# Patient Record
Sex: Female | Born: 1990 | Race: Black or African American | Hispanic: No | Marital: Single | Smoking: Current every day smoker
Health system: Southern US, Community
[De-identification: ages and names within clinical notes are randomized; demographics above are authoritative.]

## PROBLEM LIST (undated history)

## (undated) DIAGNOSIS — R87810 Cervical high risk human papillomavirus (HPV) DNA test positive: Principal | ICD-10-CM

## (undated) DIAGNOSIS — Z789 Other specified health status: Secondary | ICD-10-CM

## (undated) HISTORY — DX: Cervical high risk human papillomavirus (HPV) DNA test positive: R87.810

## (undated) HISTORY — PX: INDUCED ABORTION: SHX677

---

## 2015-04-04 ENCOUNTER — Encounter (HOSPITAL_COMMUNITY): Payer: Self-pay | Admitting: Emergency Medicine

## 2015-04-04 ENCOUNTER — Emergency Department (HOSPITAL_COMMUNITY)
Admission: EM | Admit: 2015-04-04 | Discharge: 2015-04-04 | Disposition: A | Discharge status: Home / Self Care | Payer: Medicaid Other | Attending: Emergency Medicine | Admitting: Emergency Medicine

## 2015-04-04 DIAGNOSIS — Z3202 Encounter for pregnancy test, result negative: Secondary | ICD-10-CM | POA: Insufficient documentation

## 2015-04-04 DIAGNOSIS — A64 Unspecified sexually transmitted disease: Secondary | ICD-10-CM

## 2015-04-04 DIAGNOSIS — N898 Other specified noninflammatory disorders of vagina: Secondary | ICD-10-CM | POA: Diagnosis present

## 2015-04-04 LAB — URINALYSIS, ROUTINE W REFLEX MICROSCOPIC
BILIRUBIN URINE: NEGATIVE
GLUCOSE, UA: NEGATIVE mg/dL
HGB URINE DIPSTICK: NEGATIVE
KETONES UR: NEGATIVE mg/dL
Nitrite: NEGATIVE
PROTEIN: NEGATIVE mg/dL
Specific Gravity, Urine: >1.03 — ABNORMAL HIGH (ref 1.005–1.030)
pH: 5.5 (ref 5.0–8.0)

## 2015-04-04 LAB — WET PREP, GENITAL
SPERM: NONE SEEN
Yeast Wet Prep HPF POC: NONE SEEN

## 2015-04-04 LAB — PREGNANCY, URINE: Preg Test, Ur: NEGATIVE

## 2015-04-04 LAB — URINE MICROSCOPIC-ADD ON: RBC / HPF: NONE SEEN RBC/hpf (ref 0–5)

## 2015-04-04 MED ORDER — METRONIDAZOLE 500 MG PO TABS
500.0000 mg | ORAL_TABLET | Freq: Once | ORAL | Status: AC
  Administered 2015-04-04: 500 mg via ORAL
  Filled 2015-04-04: qty 1

## 2015-04-04 MED ORDER — LIDOCAINE HCL (PF) 1 % IJ SOLN
INTRAMUSCULAR | Status: AC
  Administered 2015-04-04: 5 mL
  Filled 2015-04-04: qty 5

## 2015-04-04 MED ORDER — METRONIDAZOLE 500 MG PO TABS: 500.0000 mg | ORAL_TABLET | Freq: Three times a day (TID) | ORAL | Status: DC

## 2015-04-04 MED ORDER — AZITHROMYCIN 1 G PO PACK
1.0000 g | PACK | Freq: Once | ORAL | Status: AC
  Administered 2015-04-04: 1 g via ORAL
  Filled 2015-04-04: qty 1

## 2015-04-04 MED ORDER — CEFTRIAXONE SODIUM 250 MG IJ SOLR
250.0000 mg | Freq: Once | INTRAMUSCULAR | Status: AC
  Administered 2015-04-04: 250 mg via INTRAMUSCULAR
  Filled 2015-04-04: qty 250

## 2015-04-04 NOTE — ED Notes (Signed)
MD Mesner at bedside updating patient.

## 2015-04-04 NOTE — ED Notes (Signed)
PT states yellow odorous vaginal discharge with irritation x1 week. PT denies any urinary symptoms and states condom use with intercourse.

## 2015-04-04 NOTE — ED Notes (Signed)
Pelvic setup at bedside.

## 2015-04-04 NOTE — ED Notes (Signed)
MD Mesner at bedside  °

## 2015-04-04 NOTE — ED Provider Notes (Signed)
CSN: [id number]     Arrival date & time 04/04/15  [id number] History   First MD Initiated Contact with Patient 04/04/15 [phone]     Chief Complaint  Patient presents with  . Vaginal Discharge     (Consider location/radiation/quality/duration/timing/severity/associated sxs/prior Treatment) Patient is a 24 y.o. female presenting with vaginal discharge.  Vaginal Discharge Quality:  Malodorous, thick and yellow Severity:  Mild Onset quality:  Gradual Duration:  3 days Timing:  Constant Progression:  Worsening Chronicity:  New Context: not after intercourse and not after urination   Relieved by:  None tried Worsened by:  Nothing tried Ineffective treatments:  None tried Associated symptoms: no dysuria     History reviewed. No pertinent past medical history. History reviewed. No pertinent past surgical history. History reviewed. No pertinent family history. Social History  Substance Use Topics  . Smoking status: Never Smoker   . Smokeless tobacco: None  . Alcohol Use: Yes     Comment: rarely   OB History    Gravida Para Term Preterm AB TAB SAB Ectopic Multiple Living            1     Review of Systems  Respiratory: Negative for cough and shortness of breath.   Genitourinary: Positive for vaginal discharge and genital sores. Negative for dysuria, difficulty urinating, vaginal pain and menstrual problem.      Allergies  Review of patient's allergies indicates no known allergies.  Home Medications   Prior to Admission medications   Medication Sig Start Date End Date Taking? Authorizing Provider  metroNIDAZOLE (FLAGYL) 500 MG tablet Take 1 tablet (500 mg total) by mouth 3 (three) times daily. 04/04/15   Barbara CowerJason Eleanor Dimichele, MD   BP 128/90 mmHg  Pulse 74  Temp(Src) 97.7 F (36.5 C) (Oral)  Resp 14  SpO2 100%  LMP 03/21/2015 Physical Exam  Constitutional: She is oriented to person, place, and time. She appears well-developed and well-nourished.  HENT:  Head: Normocephalic and  atraumatic.  Eyes: Pupils are equal, round, and reactive to light.  Neck: Normal range of motion.  Cardiovascular: Normal rate and regular rhythm.   Pulmonary/Chest: Effort normal and breath sounds normal. No stridor. No respiratory distress.  Abdominal: Soft. She exhibits no distension. There is no tenderness.  Genitourinary: Cervix exhibits discharge. Cervix exhibits no motion tenderness. Right adnexum displays no tenderness and no fullness. Left adnexum displays no tenderness and no fullness. No erythema, tenderness or bleeding in the vagina. Vaginal discharge found.  Musculoskeletal: Normal range of motion. She exhibits no edema or tenderness.  Neurological: She is alert and oriented to person, place, and time.  Skin: Skin is warm and dry.  Nursing note and vitals reviewed.   ED Course  Procedures (including critical care time) Labs Review Labs Reviewed  WET PREP, GENITAL - Abnormal; Notable for the following:    Trich, Wet Prep PRESENT (*)    Clue Cells Wet Prep HPF POC PRESENT (*)    WBC, Wet Prep HPF POC MANY (*)    All other components within normal limits  URINALYSIS, ROUTINE W REFLEX MICROSCOPIC (NOT [facility]) - Abnormal; Notable for the following:    Specific Gravity, Urine >1.030 (*)    Leukocytes, UA SMALL (*)    All other components within normal limits  URINE MICROSCOPIC-ADD ON - Abnormal; Notable for the following:    Squamous Epithelial / LPF 6-30 (*)    Bacteria, UA FEW (*)    All other components within normal limits  PREGNANCY,  URINE  RPR  HIV ANTIBODY (ROUTINE TESTING)  GC/CHLAMYDIA PROBE AMP ([place]) NOT AT [facility]  WET PREP  (BD AFFIRM) ()    Imaging Review No results found. I have personally reviewed and evaluated these images and lab results as part of my medical decision-making.   EKG Interpretation None      MDM   Final diagnoses:  STD (female)   eval for STD, UTI.   Urine ok. Trich and BV on wet prep. Will ppx tx for std's.  tx for bv, advise probiotics.       Marily Memos, MD 04/04/15 [phone]

## 2015-04-05 LAB — HIV ANTIBODY (ROUTINE TESTING W REFLEX): HIV SCREEN 4TH GENERATION: NONREACTIVE

## 2015-04-05 LAB — RPR: RPR: NONREACTIVE

## 2015-04-06 LAB — GC/CHLAMYDIA PROBE AMP (~~LOC~~) NOT AT ARMC
CHLAMYDIA, DNA PROBE: NEGATIVE
Neisseria Gonorrhea: NEGATIVE

## 2015-04-11 ENCOUNTER — Encounter: Payer: Self-pay | Admitting: Obstetrics & Gynecology

## 2015-04-11 ENCOUNTER — Other Ambulatory Visit: Payer: Self-pay | Admitting: Obstetrics & Gynecology

## 2015-05-26 ENCOUNTER — Emergency Department (HOSPITAL_COMMUNITY): Payer: Medicaid Other

## 2015-05-26 ENCOUNTER — Encounter (HOSPITAL_COMMUNITY): Payer: Self-pay | Admitting: Emergency Medicine

## 2015-05-26 ENCOUNTER — Emergency Department (HOSPITAL_COMMUNITY)
Admission: EM | Admit: 2015-05-26 | Discharge: 2015-05-26 | Disposition: A | Discharge status: Home / Self Care | Payer: Medicaid Other | Attending: Emergency Medicine | Admitting: Emergency Medicine

## 2015-05-26 DIAGNOSIS — Y93E5 Activity, floor mopping and cleaning: Secondary | ICD-10-CM | POA: Diagnosis not present

## 2015-05-26 DIAGNOSIS — Y990 Civilian activity done for income or pay: Secondary | ICD-10-CM | POA: Insufficient documentation

## 2015-05-26 DIAGNOSIS — Z792 Long term (current) use of antibiotics: Secondary | ICD-10-CM | POA: Diagnosis not present

## 2015-05-26 DIAGNOSIS — W010XXA Fall on same level from slipping, tripping and stumbling without subsequent striking against object, initial encounter: Secondary | ICD-10-CM | POA: Diagnosis not present

## 2015-05-26 DIAGNOSIS — S8392XA Sprain of unspecified site of left knee, initial encounter: Secondary | ICD-10-CM | POA: Insufficient documentation

## 2015-05-26 DIAGNOSIS — Y9289 Other specified places as the place of occurrence of the external cause: Secondary | ICD-10-CM | POA: Insufficient documentation

## 2015-05-26 DIAGNOSIS — S8992XA Unspecified injury of left lower leg, initial encounter: Secondary | ICD-10-CM | POA: Diagnosis present

## 2015-05-26 MED ORDER — IBUPROFEN 600 MG PO TABS: 600.0000 mg | ORAL_TABLET | Freq: Four times a day (QID) | ORAL | Status: DC | PRN

## 2015-05-26 NOTE — ED Provider Notes (Signed)
CSN: [id number]     Arrival date & time 05/26/15  1533 History  By signing my name below, I, Doreatha Martin, attest that this documentation has been prepared under the direction and in the presence of Helon Wisinski, PA-C. Electronically Signed: Doreatha Martin, ED Scribe. 05/26/2015. 4:11 PM.    Chief Complaint  Patient presents with  . Knee Pain   The history is provided by the patient. No language interpreter was used.    HPI Comments: Colleen Hamilton is a 25 y.o. female otherwise healthy, who presents to the Emergency Department complaining of moderate frontal left knee pain s/p mechanical fall at work at [employer]. Pt states she was mopping when she slipped and fell onto her left knee. No LOC or head injury. She notes that her shoes were not slip resistant. She states pain is worsened with flexion. She reports moderate relief of pain with ice. Pt denies taking OTC medications at home to improve symptoms. No h/o prior injury to the knee. Pt works at [employer] where the injury occurred.  She denies weakness, back pain, hip pain, numbness, ankle pain, right knee pain, and additional injuries.   History reviewed. No pertinent past medical history. History reviewed. No pertinent past surgical history. No family history on file. Social History  Substance Use Topics  . Smoking status: Never Smoker   . Smokeless tobacco: None  . Alcohol Use: No     Comment: rarely   OB History    Gravida Para Term Preterm AB TAB SAB Ectopic Multiple Living            1     Review of Systems  Gastrointestinal: Negative for nausea and vomiting.  Genitourinary: Negative for dysuria and difficulty urinating.  Musculoskeletal: Positive for arthralgias. Negative for back pain and neck pain.  Skin: Negative for wound.  Neurological: Negative for dizziness, weakness and numbness.   Allergies  Review of patient's allergies indicates no known allergies.  Home Medications   Prior to Admission medications   Medication  Sig Start Date End Date Taking? Authorizing Provider  metroNIDAZOLE (FLAGYL) 500 MG tablet Take 1 tablet (500 mg total) by mouth 3 (three) times daily. 04/04/15   Marily Memos, MD   BP 136/67 mmHg  Pulse 99  Temp(Src) 99.4 F (37.4 C) (Oral)  Resp 16  Ht  (1.6 m)  SpO2 100%  LMP 05/12/2015 Physical Exam  Constitutional: She is oriented to person, place, and time. She appears well-developed and well-nourished.  HENT:  Head: Normocephalic and atraumatic.  Eyes: Conjunctivae and EOM are normal. Pupils are equal, round, and reactive to light.  Neck: Normal range of motion. Neck supple.  Cardiovascular: Normal rate, regular rhythm, normal heart sounds and intact distal pulses.  Exam reveals no gallop and no friction rub.   No murmur heard. Pulmonary/Chest: Effort normal and breath sounds normal. No respiratory distress. She has no wheezes. She has no rales. She exhibits no tenderness.  Abdominal: She exhibits no distension.  Musculoskeletal: Normal range of motion. She exhibits tenderness.  Tenderness at the left patellar tendon without patellar deformity. Has FROM of the knee. Mild crepitus.   Neurological: She is alert and oriented to person, place, and time.  Skin: Skin is warm and dry.  Psychiatric: She has a normal mood and affect. Her behavior is normal.  Nursing note and vitals reviewed.   ED Course  Procedures (including critical care time) DIAGNOSTIC STUDIES: Oxygen Saturation is 100% on RA, normal by my interpretation.  COORDINATION OF CARE: 4:10 PM Discussed treatment plan with pt at bedside which includes XR and pt agreed to plan.  Imaging Review Dg Knee Complete 4 Views Left  05/26/2015  CLINICAL DATA:  Anterior left knee pain after fall EXAM: LEFT KNEE - COMPLETE 4+ VIEW COMPARISON:  None. FINDINGS: There is no evidence of fracture, dislocation, or joint effusion. There is no evidence of arthropathy or other focal bone abnormality. Soft tissues are unremarkable.  IMPRESSION: Negative. Electronically Signed   By: Delbert Phenix M.D.   On: 05/26/2015 16:39   I have personally reviewed and evaluated these images as part of my medical decision-making.  MDM   Final diagnoses:  Sprain, knee, left, initial encounter    Patient X-Ray negative for obvious fracture or dislocation.  Pt advised to follow up with orthopedics. Patient given ACE wrap while in ED, conservative therapy recommended and discussed. Patient will be discharged home & is agreeable with above plan. Returns precautions discussed. Pt appears safe for discharge.   I personally performed the services described in this documentation, which was scribed in my presence. The recorded information has been reviewed and is accurate.   Pauline Aus, PA-C 05/26/15 1656  Donnetta Hutching, MD 05/26/15 2151

## 2015-05-26 NOTE — ED Notes (Signed)
Patient states that she slipped and fell on her left knee while mopping at work. Sates pain while bending knee and walking on knee.

## 2015-05-26 NOTE — Discharge Instructions (Signed)
Knee Sprain °A knee sprain is a tear in the strong bands of tissue that connect the bones (ligaments) of your knee. °HOME CARE °· Raise (elevate) your injured knee to lessen puffiness (swelling). °· To ease pain and puffiness, put ice on the injured area. °¨ Put ice in a plastic bag. °¨ Place a towel between your skin and the bag. °¨ Leave the ice on for 20 minutes, 2-3 times a day. °· Only take medicine as told by your doctor. °· Do not leave your knee unprotected until pain and stiffness go away (usually 4-6 weeks). °· If you have a cast or splint, do not get it wet. If your doctor told you to not take it off, cover it with a plastic bag when you shower or bathe. Do not swim. °· Your doctor may have you do exercises to prevent or limit permanent weakness and stiffness. °GET HELP RIGHT AWAY IF:  °· Your cast or splint becomes damaged. °· Your pain gets worse. °· You have a lot of pain, puffiness, or numbness below the cast or splint. °MAKE SURE YOU:  °· Understand these instructions. °· Will watch your condition. °· Will get help right away if you are not doing well or get worse. °  °This information is not intended to replace advice given to you by your health care provider. Make sure you discuss any questions you have with your health care provider. °  °Document Released: 03/11/2009 Document Revised: 03/28/2013 Document Reviewed: 11/29/2012 °Elsevier Interactive Patient Education ©2016 Elsevier Inc. ° °

## 2015-06-18 ENCOUNTER — Emergency Department (HOSPITAL_COMMUNITY)
Admission: EM | Admit: 2015-06-18 | Discharge: 2015-06-18 | Disposition: A | Discharge status: Home / Self Care | Payer: Medicaid Other | Attending: Emergency Medicine | Admitting: Emergency Medicine

## 2015-06-18 ENCOUNTER — Encounter (HOSPITAL_COMMUNITY): Payer: Self-pay | Admitting: *Deleted

## 2015-06-18 DIAGNOSIS — Z202 Contact with and (suspected) exposure to infections with a predominantly sexual mode of transmission: Secondary | ICD-10-CM | POA: Diagnosis present

## 2015-06-18 DIAGNOSIS — N760 Acute vaginitis: Secondary | ICD-10-CM | POA: Diagnosis not present

## 2015-06-18 DIAGNOSIS — B9689 Other specified bacterial agents as the cause of diseases classified elsewhere: Secondary | ICD-10-CM

## 2015-06-18 LAB — URINALYSIS, ROUTINE W REFLEX MICROSCOPIC
BILIRUBIN URINE: NEGATIVE
Glucose, UA: NEGATIVE mg/dL
Hgb urine dipstick: NEGATIVE
KETONES UR: NEGATIVE mg/dL
LEUKOCYTES UA: NEGATIVE
NITRITE: NEGATIVE
PH: 5.5 (ref 5.0–8.0)
Protein, ur: NEGATIVE mg/dL
SPECIFIC GRAVITY, URINE: 1.025 (ref 1.005–1.030)

## 2015-06-18 LAB — WET PREP, GENITAL
SPERM: NONE SEEN
TRICH WET PREP: NONE SEEN
YEAST WET PREP: NONE SEEN

## 2015-06-18 LAB — POC URINE PREG, ED: PREG TEST UR: NEGATIVE

## 2015-06-18 MED ORDER — METRONIDAZOLE 500 MG PO TABS: 500.0000 mg | ORAL_TABLET | Freq: Two times a day (BID) | ORAL | Status: DC

## 2015-06-18 NOTE — ED Notes (Signed)
Pt states she would like to be checked for STD's. Pt denies any symptoms, denies pain and states she has not been exposed to any STD's that she knows of. Pt states she has had unprotected sex.

## 2015-06-18 NOTE — ED Provider Notes (Signed)
CSN: [id number]     Arrival date & time 06/18/15  0735 History   First MD Initiated Contact with Patient 06/18/15 0755     Chief Complaint  Patient presents with  . Well Check      (Consider location/radiation/quality/duration/timing/severity/associated sxs/prior Treatment) Patient is a 25 y.o. female presenting with STD exposure. The history is provided by the patient.  Exposure to STD This is a new problem. The current episode started in the past 7 days. The problem has been unchanged. Pertinent negatives include no chills, fever, myalgias or rash. Nothing aggravates the symptoms. She has tried nothing for the symptoms. The treatment provided no relief.    History reviewed. No pertinent past medical history. History reviewed. No pertinent past surgical history. No family history on file. Social History  Substance Use Topics  . Smoking status: Never Smoker   . Smokeless tobacco: None  . Alcohol Use: No     Comment: rarely   OB History    Gravida Para Term Preterm AB TAB SAB Ectopic Multiple Living            1     Review of Systems  Constitutional: Negative for fever and chills.  Genitourinary: Positive for vaginal discharge. Negative for dysuria, vaginal bleeding and difficulty urinating.  Musculoskeletal: Negative for myalgias.  Skin: Negative for rash.  All other systems reviewed and are negative.     Allergies  Review of patient's allergies indicates no known allergies.  Home Medications   Prior to Admission medications   Medication Sig Start Date End Date Taking? Authorizing Provider  ibuprofen (ADVIL,MOTRIN) 600 MG tablet Take 1 tablet (600 mg total) by mouth every 6 (six) hours as needed for moderate pain. Take with food 05/26/15   Tammy Triplett, PA-C  metroNIDAZOLE (FLAGYL) 500 MG tablet Take 1 tablet (500 mg total) by mouth 3 (three) times daily. 04/04/15   Marily Memos, MD   BP 117/81 mmHg  Pulse 76  Temp(Src) 97.8 F (36.6 C) (Oral)  Resp 16  Ht 5'  2" (1.575 m)  Wt 63.504 kg  BMI 25.60 kg/m2  SpO2 100%  LMP 06/09/2015 Physical Exam  Constitutional: She is oriented to person, place, and time. She appears well-developed and well-nourished.  Non-toxic appearance.  HENT:  Head: Normocephalic.  Right Ear: Tympanic membrane and external ear normal.  Left Ear: Tympanic membrane and external ear normal.  Eyes: EOM and lids are normal. Pupils are equal, round, and reactive to light.  Neck: Normal range of motion. Neck supple. Carotid bruit is not present.  Cardiovascular: Normal rate, regular rhythm, normal heart sounds, intact distal pulses and normal pulses.   Pulmonary/Chest: Breath sounds normal. No respiratory distress.  Abdominal: Soft. Bowel sounds are normal. There is no tenderness. There is no guarding.  Genitourinary:  Chaperone present during the examination. There no rashes or lesions of the external vulva. There is mild to moderate discharge in the vaginal vault. There is blood in the vaginal vault. The os of the cervix is closed. There is no adnexal mass noted. There is no wall motion tenderness noted. There is no foreign body noted in the vagina.  Musculoskeletal: Normal range of motion.  Lymphadenopathy:       Head (right side): No submandibular adenopathy present.       Head (left side): No submandibular adenopathy present.    She has no cervical adenopathy.  Neurological: She is alert and oriented to person, place, and time. She has normal strength. No cranial  nerve deficit or sensory deficit.  Skin: Skin is warm and dry.  Psychiatric: She has a normal mood and affect. Her speech is normal.  Nursing note and vitals reviewed.   ED Course  Procedures (including critical care time) Labs Review Labs Reviewed - No data to display  Imaging Review No results found. I have personally reviewed and evaluated these images and lab results as part of my medical decision-making.   EKG Interpretation None      MDM  Prep  reveals clue cells and white blood cells, consistent with bacterial vaginosis. Urinalysis is well within normal limits. Urine pregnancy test is negative.  The patient will be treated with Flagyl. The RPR, HIV, and Chlamydia testing pending. I discussed these findings with the patient. Also discussed safe sex with the patient. Questions were answered.    Final diagnoses:  None    *I have reviewed nursing notes, vital signs, and all appropriate lab and imaging results for this patient.[address]**    Vestal Crandall, PA-C 06/20/15 1230  Bethann BerkshireJoseph Zammit, MD 06/20/15 [phone]

## 2015-06-18 NOTE — Discharge Instructions (Signed)
Please take flagyl with a meal. Do not use any alcohol or any kind while taking the flagyl. Please refrain from all sexual activity for the next 7 days. Bacterial Vaginosis Bacterial vaginosis is an infection of the vagina. It happens when too many germs (bacteria) grow in the vagina. Having this infection puts you at risk for getting other infections from sex. Treating this infection can help lower your risk for other infections, such as:   Chlamydia.  Gonorrhea.  HIV.  Herpes. HOME CARE  Take your medicine as told by your doctor.  Finish your medicine even if you start to feel better.  Tell your sex partner that you have an infection. They should see their doctor for treatment.  During treatment:  Avoid sex or use condoms correctly.  Do not douche.  Do not drink alcohol unless your doctor tells you it is ok.  Do not breastfeed unless your doctor tells you it is ok. GET HELP IF:  You are not getting better after 3 days of treatment.  You have more grey fluid (discharge) coming from your vagina than before.  You have more pain than before.  You have a fever. MAKE SURE YOU:   Understand these instructions.  Will watch your condition.  Will get help right away if you are not doing well or get worse.   This information is not intended to replace advice given to you by your health care provider. Make sure you discuss any questions you have with your health care provider.   Document Released: 12/31/2007 Document Revised: 04/13/2014 Document Reviewed: 11/02/2012 Elsevier Interactive Patient Education [employer]2016 Elsevier Inc.

## 2015-06-18 NOTE — ED Notes (Signed)
Pt states she comes in to check for STDs. Pt denies any symptoms but states she has unprotected sex.

## 2015-06-19 LAB — GC/CHLAMYDIA PROBE AMP (~~LOC~~) NOT AT ARMC
Chlamydia: NEGATIVE
Neisseria Gonorrhea: NEGATIVE

## 2015-06-19 LAB — RPR: RPR Ser Ql: NONREACTIVE

## 2015-06-19 LAB — HIV ANTIBODY (ROUTINE TESTING W REFLEX): HIV Screen 4th Generation wRfx: NONREACTIVE

## 2016-01-15 ENCOUNTER — Inpatient Hospital Stay (HOSPITAL_COMMUNITY): Payer: Medicaid Other

## 2016-01-15 ENCOUNTER — Inpatient Hospital Stay (HOSPITAL_COMMUNITY)
Admission: AD | Admit: 2016-01-15 | Discharge: 2016-01-15 | Disposition: A | Discharge status: Home / Self Care | Payer: Medicaid Other | Source: Ambulatory Visit | Attending: Family Medicine | Admitting: Family Medicine

## 2016-01-15 ENCOUNTER — Encounter (HOSPITAL_COMMUNITY): Payer: Self-pay | Admitting: *Deleted

## 2016-01-15 DIAGNOSIS — Z87891 Personal history of nicotine dependence: Secondary | ICD-10-CM | POA: Insufficient documentation

## 2016-01-15 DIAGNOSIS — Z3A09 9 weeks gestation of pregnancy: Secondary | ICD-10-CM | POA: Diagnosis not present

## 2016-01-15 DIAGNOSIS — O209 Hemorrhage in early pregnancy, unspecified: Secondary | ICD-10-CM | POA: Diagnosis not present

## 2016-01-15 HISTORY — DX: Other specified health status: Z78.9

## 2016-01-15 LAB — URINALYSIS, ROUTINE W REFLEX MICROSCOPIC
BILIRUBIN URINE: NEGATIVE
GLUCOSE, UA: NEGATIVE mg/dL
KETONES UR: NEGATIVE mg/dL
Leukocytes, UA: NEGATIVE
Nitrite: NEGATIVE
PH: 8 (ref 5.0–8.0)
PROTEIN: 30 mg/dL — AB
Specific Gravity, Urine: 1.025 (ref 1.005–1.030)

## 2016-01-15 LAB — CBC
HEMATOCRIT: 37.9 % (ref 36.0–46.0)
HEMOGLOBIN: 12.9 g/dL (ref 12.0–15.0)
MCH: 29.8 pg (ref 26.0–34.0)
MCHC: 34 g/dL (ref 30.0–36.0)
MCV: 87.5 fL (ref 78.0–100.0)
Platelets: 183 10*3/uL (ref 150–400)
RBC: 4.33 MIL/uL (ref 3.87–5.11)
RDW: 12.8 % (ref 11.5–15.5)
WBC: 10.1 10*3/uL (ref 4.0–10.5)

## 2016-01-15 LAB — ABO/RH: ABO/RH(D): O POS

## 2016-01-15 LAB — URINE MICROSCOPIC-ADD ON: BACTERIA UA: NONE SEEN

## 2016-01-15 LAB — HCG, QUANTITATIVE, PREGNANCY: HCG, BETA CHAIN, QUANT, S: 132673 m[IU]/mL — AB (ref <5)

## 2016-01-15 LAB — POCT PREGNANCY, URINE: PREG TEST UR: POSITIVE — AB

## 2016-01-15 NOTE — Discharge Instructions (Signed)
Vaginal Bleeding During Pregnancy, First Trimester °A small amount of bleeding (spotting) from the vagina is relatively common in early pregnancy. It usually stops on its own. Various things may cause bleeding or spotting in early pregnancy. Some bleeding may be related to the pregnancy, and some may not. In most cases, the bleeding is normal and is not a problem. However, bleeding can also be a sign of something serious. Be sure to tell your health care provider about any vaginal bleeding right away. °Some possible causes of vaginal bleeding during the first trimester include: °· Infection or inflammation of the cervix. °· Growths (polyps) on the cervix. °· Miscarriage or threatened miscarriage. °· Pregnancy tissue has developed outside of the uterus and in a fallopian tube (tubal pregnancy). °· Tiny cysts have developed in the uterus instead of pregnancy tissue (molar pregnancy). °HOME CARE INSTRUCTIONS  °Watch your condition for any changes. The following actions may help to lessen any discomfort you are feeling: °· Follow your health care provider's instructions for limiting your activity. If your health care provider orders bed rest, you may need to stay in bed and only get up to use the bathroom. However, your health care provider may allow you to continue light activity. °· If needed, make plans for someone to help with your regular activities and responsibilities while you are on bed rest. °· Keep track of the number of pads you use each day, how often you change pads, and how soaked (saturated) they are. Write this down. °· Do not use tampons. Do not douche. °· Do not have sexual intercourse or orgasms until approved by your health care provider. °· If you pass any tissue from your vagina, save the tissue so you can show it to your health care provider. °· Only take over-the-counter or prescription medicines as directed by your health care provider. °· Do not take aspirin because it can make you  bleed. °· Keep all follow-up appointments as directed by your health care provider. °SEEK MEDICAL CARE IF: °· You have any vaginal bleeding during any part of your pregnancy. °· You have cramps or labor pains. °· You have a fever, not controlled by medicine. °SEEK IMMEDIATE MEDICAL CARE IF:  °· You have severe cramps in your back or belly (abdomen). °· You pass large clots or tissue from your vagina. °· Your bleeding increases. °· You feel light-headed or weak, or you have fainting episodes. °· You have chills. °· You are leaking fluid or have a gush of fluid from your vagina. °· You pass out while having a bowel movement. °MAKE SURE YOU: °· Understand these instructions. °· Will watch your condition. °· Will get help right away if you are not doing well or get worse. °  °This information is not intended to replace advice given to you by your health care provider. Make sure you discuss any questions you have with your health care provider. °  °Document Released: 12/31/2004 Document Revised: 03/28/2013 Document Reviewed: 11/28/2012 °Elsevier Interactive Patient Education ©2016 Elsevier Inc. ° °Pelvic Rest °Pelvic rest is sometimes recommended for women when:  °· The placenta is partially or completely covering the opening of the cervix (placenta previa). °· There is bleeding between the uterine wall and the amniotic sac in the first trimester (subchorionic hemorrhage). °· The cervix begins to open without labor starting (incompetent cervix, cervical insufficiency). °· The labor is too early (preterm labor). °HOME CARE INSTRUCTIONS °· Do not have sexual intercourse, stimulation, or an orgasm. °· Do   not use tampons, douche, or put anything in the vagina. °· Do not lift anything over 10 pounds (4.5 kg). °· Avoid strenuous activity or straining your pelvic muscles. °SEEK MEDICAL CARE IF:  °· You have any vaginal bleeding during pregnancy. Treat this as a potential emergency. °· You have cramping pain felt low in the  stomach (stronger than menstrual cramps). °· You notice vaginal discharge (watery, mucus, or bloody). °· You have a low, dull backache. °· There are regular contractions or uterine tightening. °SEEK IMMEDIATE MEDICAL CARE IF: °You have vaginal bleeding and have placenta previa.  °  °This information is not intended to replace advice given to you by your health care provider. Make sure you discuss any questions you have with your health care provider. °  °Document Released: 07/18/2010 Document Revised: 06/15/2011 Document Reviewed: 09/24/2014 °Elsevier Interactive Patient Education ©2016 Elsevier Inc. ° °

## 2016-01-15 NOTE — MAU Note (Signed)
C/o sudden vaginal bleeding; had positive home pregnancy test (3 days ago); guessing her LNMP was 11-09-15; denies any pain;

## 2016-01-15 NOTE — MAU Provider Note (Signed)
History     CSN: [id number]  Arrival date and time: 01/15/16 1407   First Provider Initiated Contact with Patient 01/15/16 1449      Chief Complaint  Patient presents with  . Vaginal Bleeding   HPI   [name] is a 25 y.o. female G3P1 @ [redacted]w[redacted]d here in MAU with complaints of heavy vaginal bleeding.  The bleeding started around 1300 today. The bleeding is heavy like a period.  The patient was transported by EMS here.   Denies pain at this time.   OB History    Gravida Para Term Preterm AB Living   3 1       1    SAB TAB Ectopic Multiple Live Births           1      Past Medical History:  Diagnosis Date  . Medical history non-contributory     Past Surgical History:  Procedure Laterality Date  . INDUCED ABORTION      Family History  Problem Relation Age of Onset  . Hypertension Mother     Social History  Substance Use Topics  . Smoking status: Former Games developer  . Smokeless tobacco: Former Neurosurgeon  . Alcohol use No     Comment: rarely    Allergies: No Known Allergies  Prescriptions Prior to Admission  Medication Sig Dispense Refill Last Dose  . metroNIDAZOLE (FLAGYL) 500 MG tablet Take 1 tablet (500 mg total) by mouth 2 (two) times daily. 14 tablet 0    Results for orders placed or performed during the hospital encounter of 01/15/16 (from the past 48 hour(s))  Pregnancy, urine POC     Status: Abnormal   Collection Time: 01/15/16  2:41 PM  Result Value Ref Range   Preg Test, Ur POSITIVE (A) NEGATIVE    Comment:        THE SENSITIVITY OF THIS METHODOLOGY IS >24 mIU/mL    Results for orders placed or performed during the hospital encounter of 01/15/16 (from the past 48 hour(s))  Urinalysis, Routine w reflex microscopic (not at [facility])     Status: Abnormal   Collection Time: 01/15/16  2:35 PM  Result Value Ref Range   Color, Urine RED (A) YELLOW    Comment: BIOCHEMICALS MAY BE AFFECTED BY COLOR   APPearance CLOUDY (A) CLEAR   Specific Gravity, Urine  1.025 1.005 - 1.030   pH 8.0 5.0 - 8.0   Glucose, UA NEGATIVE NEGATIVE mg/dL   Hgb urine dipstick LARGE (A) NEGATIVE   Bilirubin Urine NEGATIVE NEGATIVE   Ketones, ur NEGATIVE NEGATIVE mg/dL   Protein, ur 30 (A) NEGATIVE mg/dL   Nitrite NEGATIVE NEGATIVE   Leukocytes, UA NEGATIVE NEGATIVE  Urine microscopic-add on     Status: Abnormal   Collection Time: 01/15/16  2:35 PM  Result Value Ref Range   Squamous Epithelial / LPF 0-5 (A) NONE SEEN   WBC, UA 0-5 0 - 5 WBC/hpf   RBC / HPF TOO NUMEROUS TO COUNT 0 - 5 RBC/hpf   Bacteria, UA NONE SEEN NONE SEEN   Urine-Other AMORPHOUS URATES/PHOSPHATES   Pregnancy, urine POC     Status: Abnormal   Collection Time: 01/15/16  2:41 PM  Result Value Ref Range   Preg Test, Ur POSITIVE (A) NEGATIVE    Comment:        THE SENSITIVITY OF THIS METHODOLOGY IS >24 mIU/mL   CBC     Status: None   Collection Time: 01/15/16  3:05 PM  Result  Value Ref Range   WBC 10.1 4.0 - 10.5 K/uL   RBC 4.33 3.87 - 5.11 MIL/uL   Hemoglobin 12.9 12.0 - 15.0 g/dL   HCT 29.537.9 28.436.0 - 13.246.0 %   MCV 87.5 78.0 - 100.0 fL   MCH 29.8 26.0 - 34.0 pg   MCHC 34.0 30.0 - 36.0 g/dL   RDW 44.012.8 10.211.5 - 72.515.5 %   Platelets 183 150 - 400 K/uL  ABO/Rh     Status: None (Preliminary result)   Collection Time: 01/15/16  3:05 PM  Result Value Ref Range   ABO/RH(D) O POS   hCG, quantitative, pregnancy     Status: Abnormal   Collection Time: 01/15/16  3:05 PM  Result Value Ref Range   hCG, Beta Chain, Quant, S 132,673 (H) <5 mIU/mL    Comment:          GEST. AGE      CONC.  (mIU/mL)   <=1 WEEK        5 - 50     2 WEEKS       50 - 500     3 WEEKS       100 - 10,000     4 WEEKS     1,000 - 30,000     5 WEEKS     3,500 - 115,000   6-8 WEEKS     12,000 - 270,000    12 WEEKS     15,000 - 220,000        FEMALE AND NON-PREGNANT FEMALE:     LESS THAN 5 mIU/mL     [place] Ob Comp Less 14 Wks  Result Date: 01/15/2016 CLINICAL DATA:  Positive pregnancy.  Bleeding. EXAM: OBSTETRIC <14 WK US  AND TRANSVAGINAL OB US TECHNIQUE: Both transabdominal and transvaginal ultrasound examinations were performed for complete evaluation of the gestation as well as the maternal uterus, adnexal regions, and pelvic cul-de-sac. Transvaginal technique was performed to assess early pregnancy. COMPARISON:  None. FINDINGS: Intrauterine gestational sac: Single Yolk sac:  Visualized Embryo:  Visualized Cardiac Activity: Visualized Heart Rate: 152  bpm MSD:   mm    w     d CRL:  31  mm   9 w   6 d                  US EDC: 08/13/2016 Subchorionic hemorrhage:  None visualized. Maternal uterus/adnexae: No adnexal masses or free fluid. IMPRESSION: Single intrauterine pregnancy, 9 weeks 6 days by crown-rump length. Fetal heart rate 150 beats per minute. No acute maternal findings. Electronically Signed   By: Charlett NoseKevin  Dover M.D.   On: 01/15/2016 16:33   [place] Ob Transvaginal  Result Date: 01/15/2016 CLINICAL DATA:  Positive pregnancy.  Bleeding. EXAM: OBSTETRIC <14 WK US AND TRANSVAGINAL OB US TECHNIQUE: Both transabdominal and transvaginal ultrasound examinations were performed for complete evaluation of the gestation as well as the maternal uterus, adnexal regions, and pelvic cul-de-sac. Transvaginal technique was performed to assess early pregnancy. COMPARISON:  None. FINDINGS: Intrauterine gestational sac: Single Yolk sac:  Visualized Embryo:  Visualized Cardiac Activity: Visualized Heart Rate: 152  bpm MSD:   mm    w     d CRL:  31  mm   9 w   6 d                  US EDC: 08/13/2016 Subchorionic hemorrhage:  None visualized. Maternal uterus/adnexae: No adnexal masses or free fluid. IMPRESSION: Single intrauterine  pregnancy, 9 weeks 6 days by crown-rump length. Fetal heart rate 150 beats per minute. No acute maternal findings. Electronically Signed   By: Charlett Nose M.D.   On: 01/15/2016 16:33    Review of Systems  Constitutional: Negative for chills and fever.  Gastrointestinal: Negative for nausea and vomiting.   Genitourinary: Negative for dysuria and urgency.   Physical Exam   Blood pressure 109/62, pulse 90, temperature 97.9 F (36.6 C), temperature source Oral, resp. rate 16, last menstrual period 11/09/2015.  Physical Exam  Constitutional: She is oriented to person, place, and time. She appears well-developed and well-nourished. No distress.  HENT:  Head: Normocephalic.  Eyes: Pupils are equal, round, and reactive to light.  Neck: Neck supple.  GI: Soft. She exhibits no distension. There is no tenderness. There is no rebound.  Genitourinary:  Genitourinary Comments: Vagina - Small amount of dark red blood noted in the vagina, no odor  Cervix - No contact bleeding, no active bleeding  Bimanual exam: Cervix closed Uterus non tender, enlarged  Adnexa non tender, no masses bilaterally Chaperone present for exam.   Musculoskeletal: Normal range of motion.  Neurological: She is alert and oriented to person, place, and time.  Skin: Skin is warm. She is not diaphoretic.  Psychiatric: Her behavior is normal.    MAU Course  Procedures  None  MDM  CBC ABO Beta hcg level [place] O positive   Assessment and Plan   A:  1. Vaginal bleeding in pregnancy, first trimester   2. Vaginal bleeding in pregnancy, first trimester     P:  Discharge home in stable condition Pelvic rest Bleeding precautions Return to MAU if symptoms worsen  Duane Lope, NP 01/15/2016 4:43 PM

## 2016-01-16 LAB — HIV ANTIBODY (ROUTINE TESTING W REFLEX): HIV SCREEN 4TH GENERATION: NONREACTIVE

## 2016-03-16 ENCOUNTER — Emergency Department (HOSPITAL_COMMUNITY)
Admission: EM | Admit: 2016-03-16 | Discharge: 2016-03-16 | Disposition: A | Discharge status: Home / Self Care | Payer: Medicaid Other | Attending: Emergency Medicine | Admitting: Emergency Medicine

## 2016-03-16 ENCOUNTER — Encounter (HOSPITAL_COMMUNITY): Payer: Self-pay | Admitting: Emergency Medicine

## 2016-03-16 ENCOUNTER — Emergency Department (HOSPITAL_COMMUNITY): Payer: Medicaid Other

## 2016-03-16 DIAGNOSIS — Z3A01 Less than 8 weeks gestation of pregnancy: Secondary | ICD-10-CM | POA: Diagnosis not present

## 2016-03-16 DIAGNOSIS — N72 Inflammatory disease of cervix uteri: Secondary | ICD-10-CM

## 2016-03-16 DIAGNOSIS — O23511 Infections of cervix in pregnancy, first trimester: Secondary | ICD-10-CM | POA: Diagnosis not present

## 2016-03-16 DIAGNOSIS — O23591 Infection of other part of genital tract in pregnancy, first trimester: Secondary | ICD-10-CM | POA: Insufficient documentation

## 2016-03-16 DIAGNOSIS — N760 Acute vaginitis: Secondary | ICD-10-CM

## 2016-03-16 DIAGNOSIS — Z87891 Personal history of nicotine dependence: Secondary | ICD-10-CM | POA: Insufficient documentation

## 2016-03-16 DIAGNOSIS — N898 Other specified noninflammatory disorders of vagina: Secondary | ICD-10-CM | POA: Diagnosis present

## 2016-03-16 DIAGNOSIS — R102 Pelvic and perineal pain: Secondary | ICD-10-CM

## 2016-03-16 DIAGNOSIS — B9689 Other specified bacterial agents as the cause of diseases classified elsewhere: Secondary | ICD-10-CM

## 2016-03-16 LAB — WET PREP, GENITAL
Sperm: NONE SEEN
Trich, Wet Prep: NONE SEEN
Yeast Wet Prep HPF POC: NONE SEEN

## 2016-03-16 LAB — URINALYSIS, ROUTINE W REFLEX MICROSCOPIC
BILIRUBIN URINE: NEGATIVE
GLUCOSE, UA: NEGATIVE mg/dL
Ketones, ur: NEGATIVE mg/dL
Leukocytes, UA: NEGATIVE
NITRITE: NEGATIVE
PROTEIN: NEGATIVE mg/dL
SPECIFIC GRAVITY, URINE: 1.019 (ref 1.005–1.030)
pH: 6 (ref 5.0–8.0)

## 2016-03-16 LAB — HCG, QUANTITATIVE, PREGNANCY: HCG, BETA CHAIN, QUANT, S: 56 m[IU]/mL — AB (ref <5)

## 2016-03-16 LAB — PREGNANCY, URINE: PREG TEST UR: POSITIVE — AB

## 2016-03-16 MED ORDER — CEFTRIAXONE SODIUM 250 MG IJ SOLR
250.0000 mg | Freq: Once | INTRAMUSCULAR | Status: AC
  Administered 2016-03-16: 250 mg via INTRAMUSCULAR
  Filled 2016-03-16: qty 250

## 2016-03-16 MED ORDER — METRONIDAZOLE 500 MG PO TABS: 500.0000 mg | ORAL_TABLET | Freq: Two times a day (BID) | ORAL | 0 refills | Status: DC

## 2016-03-16 MED ORDER — LIDOCAINE HCL (PF) 1 % IJ SOLN
INTRAMUSCULAR | Status: AC
  Filled 2016-03-16: qty 5

## 2016-03-16 MED ORDER — AZITHROMYCIN 250 MG PO TABS
1000.0000 mg | ORAL_TABLET | Freq: Once | ORAL | Status: AC
  Administered 2016-03-16: 1000 mg via ORAL
  Filled 2016-03-16: qty 4

## 2016-03-16 MED ORDER — METRONIDAZOLE 500 MG PO TABS
500.0000 mg | ORAL_TABLET | Freq: Once | ORAL | Status: AC
  Administered 2016-03-16: 500 mg via ORAL
  Filled 2016-03-16: qty 1

## 2016-03-16 NOTE — ED Triage Notes (Signed)
Pt reports she terminated a pregnancy 2 1/2 weeks ago. Has had foul smell and discharge since. Pt also reports exposure to STD with a sexual partner.

## 2016-03-16 NOTE — ED Notes (Signed)
Pt was able to provide urine sample. Urine to lab at this time.

## 2016-03-16 NOTE — ED Triage Notes (Signed)
Pt reports she was contacted by a partner and told they had an STD. Pt states she has also had R sided back pain. Pt c/o "a foul smell and discharge."

## 2016-03-16 NOTE — ED Notes (Signed)
Patient transported to X-ray 

## 2016-03-16 NOTE — ED Provider Notes (Signed)
[facility]-EMER[facility]ENCY DEPT Provider Note   CSN: [id number] Arrival date & time: 03/16/16  1544     History   Chief Complaint Chief Complaint  Patient presents with  . SEXUALLY TRANSMITTED DISEASE    HPI Colleen Hamilton is a 25 y.o. female.  Pt presents to the ED with a foul smelling vaginal discharge.  She did have an abortion about 2.5 weeks ago, and it has been going on since then.  She said that she does not have abdominal pain or fever.  She does c/o right mid thoracic back pain also.  Pt has been exposed to STD by a boyfriend.      Past Medical History:  Diagnosis Date  . Medical history non-contributory     There are no active problems to display for this patient.   Past Surgical History:  Procedure Laterality Date  . INDUCED ABORTION      OB History    Gravida Para Term Preterm AB Living   3 1 0 0 1 1   SAB TAB Ectopic Multiple Live Births     1 0 0 1       Home Medications    Prior to Admission medications   Medication Sig Start Date End Date Taking? Authorizing Provider  metroNIDAZOLE (FLAGYL) 500 MG tablet Take 1 tablet (500 mg total) by mouth 2 (two) times daily. 03/16/16   Jacalyn LefevreJulie Alaina Donati, MD    Family History Family History  Problem Relation Age of Onset  . Hypertension Mother     Social History Social History  Substance Use Topics  . Smoking status: Former Games developermoker  . Smokeless tobacco: Former NeurosurgeonUser  . Alcohol use No     Comment: rarely     Allergies   Patient has no known allergies.   Review of Systems Review of Systems  Genitourinary: Positive for vaginal discharge.  Musculoskeletal: Positive for back pain.  All other systems reviewed and are negative.    Physical Exam Updated Vital Signs BP 144/71 (BP Location: Left Arm)   Pulse 63   Temp 98.2 F (36.8 C) (Oral)   Resp 18   Ht 5\' 3"  (1.6 m)   Wt 182 lb (82.6 kg)   LMP 11/09/2015   SpO2 99%   Breastfeeding? Unknown Comment: Pt had an abortion at 15 weeks, Termination 2  1/2 weeks ago  BMI 32.24 kg/m   Physical Exam  Constitutional: She is oriented to person, place, and time. She appears well-developed and well-nourished.  HENT:  Head: Normocephalic and atraumatic.  Right Ear: External ear normal.  Left Ear: External ear normal.  Nose: Nose normal.  Mouth/Throat: Oropharynx is clear and moist.  Eyes: EOM are normal. Pupils are equal, round, and reactive to light.  Neck: Normal range of motion. Neck supple.  Cardiovascular: Normal rate, regular rhythm, normal heart sounds and intact distal pulses.   Pulmonary/Chest: Effort normal and breath sounds normal.  Abdominal: Soft. Bowel sounds are normal.  Genitourinary: Uterus normal. Cervix exhibits discharge. Right adnexum displays no tenderness. Left adnexum displays no tenderness.  Musculoskeletal: Normal range of motion.       Arms: Neurological: She is alert and oriented to person, place, and time.  Skin: Skin is warm.  Psychiatric: She has a normal mood and affect. Her behavior is normal. Judgment and thought content normal.  Nursing note and vitals reviewed.    ED Treatments / Results  Labs (all labs ordered are listed, but only abnormal results are displayed) Labs Reviewed  WET  PREP, GENITAL - Abnormal; Notable for the following:       Result Value   Clue Cells Wet Prep HPF POC PRESENT (*)    WBC, Wet Prep HPF POC MODERATE (*)    All other components within normal limits  PREGNANCY, URINE - Abnormal; Notable for the following:    Preg Test, Ur POSITIVE (*)    All other components within normal limits  URINALYSIS, ROUTINE W REFLEX MICROSCOPIC - Abnormal; Notable for the following:    Hgb urine dipstick SMALL (*)    Bacteria, UA RARE (*)    All other components within normal limits  HCG, QUANTITATIVE, PREGNANCY - Abnormal; Notable for the following:    hCG, Beta Chain, Quant, S 56 (*)    All other components within normal limits  GC/CHLAMYDIA PROBE AMP ([place]) NOT [facility]     EKG  EKG Interpretation None       Radiology Dg Chest 2 View  Result Date: 03/16/2016 CLINICAL DATA:  Mid back pain. EXAM: CHEST  2 VIEW COMPARISON:  None. FINDINGS: The heart size and mediastinal contours are within normal limits. Both lungs are clear. The visualized skeletal structures are unremarkable. IMPRESSION: No active cardiopulmonary disease. Electronically Signed   By: Tollie Ethavid  Kwon M.D.   On: 03/16/2016 18:53   [place] Transvaginal Non-ob  Result Date: 03/16/2016 CLINICAL DATA:  25 y/o F; status post abortion with concern for retained products of conception. EXAM: TRANSABDOMINAL AND TRANSVAGINAL ULTRASOUND OF PELVIS TECHNIQUE: Both transabdominal and transvaginal ultrasound examinations of the pelvis were performed. Transabdominal technique was performed for global imaging of the pelvis including uterus, ovaries, adnexal regions, and pelvic cul-de-sac. It was necessary to proceed with endovaginal exam following the transabdominal exam to visualize the uterus and endometrium. COMPARISON:  01/15/2016 pelvic ultrasound. FINDINGS: Uterus Measurements: 9.9 x 7.6 x 7.0 cm. No fibroids or other mass visualized. Endometrium Thickness: 13 mm. No focal abnormality visualized. No hypervascular mass. Right ovary Measurements: 3.7 x 2.0 x 3.7 cm. Normal appearance/no adnexal mass. Left ovary Measurements: 3.1 x 1.4 x 2.7 cm. Normal appearance/no adnexal mass. Other findings No abnormal free fluid. IMPRESSION: No acute process or intrauterine pregnancy identified. No evidence for retained products of conception. Electronically Signed   By: Mitzi HansenLance  Furusawa-Stratton M.D.   On: 03/16/2016 23:26   [place] Pelvis Complete  Result Date: 03/16/2016 CLINICAL DATA:  25 y/o F; status post abortion with concern for retained products of conception. EXAM: TRANSABDOMINAL AND TRANSVAGINAL ULTRASOUND OF PELVIS TECHNIQUE: Both transabdominal and transvaginal ultrasound examinations of the pelvis were performed.  Transabdominal technique was performed for global imaging of the pelvis including uterus, ovaries, adnexal regions, and pelvic cul-de-sac. It was necessary to proceed with endovaginal exam following the transabdominal exam to visualize the uterus and endometrium. COMPARISON:  01/15/2016 pelvic ultrasound. FINDINGS: Uterus Measurements: 9.9 x 7.6 x 7.0 cm. No fibroids or other mass visualized. Endometrium Thickness: 13 mm. No focal abnormality visualized. No hypervascular mass. Right ovary Measurements: 3.7 x 2.0 x 3.7 cm. Normal appearance/no adnexal mass. Left ovary Measurements: 3.1 x 1.4 x 2.7 cm. Normal appearance/no adnexal mass. Other findings No abnormal free fluid. IMPRESSION: No acute process or intrauterine pregnancy identified. No evidence for retained products of conception. Electronically Signed   By: Mitzi HansenLance  Furusawa-Stratton M.D.   On: 03/16/2016 23:26    Procedures Procedures (including critical care time)  Medications Ordered in ED Medications  lidocaine (PF) (XYLOCAINE) 1 % injection (not administered)  cefTRIAXone (ROCEPHIN) injection 250 mg (250 mg  Intramuscular Given 03/16/16 1943)  azithromycin (ZITHROMAX) tablet 1,000 mg (1,000 mg Oral Given 03/16/16 1943)  metroNIDAZOLE (FLAGYL) tablet 500 mg (500 mg Oral Given 03/16/16 2017)     Initial Impression / Assessment and Plan / ED Course  I have reviewed the triage vital signs and the nursing notes.  Pertinent labs & imaging results that were available during my care of the patient were reviewed by me and considered in my medical decision making (see chart for details).  Clinical Course    +preg from hormone level on the way down.  US shows no retained poc.  Pt treated here with rocephin and zithromax for  chlamydia and gonorrhea.  She was given flagyl for bv.  She knows to return if worse and all sexual partners to get treated.  Final Clinical Impressions(s) / ED Diagnoses   Final diagnoses:  Bacterial vaginosis   Cervicitis    New Prescriptions Discharge Medication List as of 03/16/2016 11:36 PM       Jacalyn Lefevre, MD 03/16/16 2353

## 2016-03-18 LAB — GC/CHLAMYDIA PROBE AMP (~~LOC~~) NOT AT ARMC
CHLAMYDIA, DNA PROBE: NEGATIVE
NEISSERIA GONORRHEA: NEGATIVE

## 2016-04-09 ENCOUNTER — Encounter: Payer: Self-pay | Admitting: Obstetrics and Gynecology

## 2016-04-20 ENCOUNTER — Ambulatory Visit (INDEPENDENT_AMBULATORY_CARE_PROVIDER_SITE_OTHER): Payer: Medicaid Other | Admitting: Obstetrics and Gynecology

## 2016-04-20 ENCOUNTER — Encounter: Payer: Self-pay | Admitting: Obstetrics and Gynecology

## 2016-04-20 VITALS — BP 101/60 | HR 85 | Wt 187.0 lb

## 2016-04-20 DIAGNOSIS — Z3009 Encounter for other general counseling and advice on contraception: Secondary | ICD-10-CM

## 2016-04-20 DIAGNOSIS — Z1159 Encounter for screening for other viral diseases: Secondary | ICD-10-CM

## 2016-04-20 DIAGNOSIS — Z118 Encounter for screening for other infectious and parasitic diseases: Secondary | ICD-10-CM

## 2016-04-20 DIAGNOSIS — N898 Other specified noninflammatory disorders of vagina: Secondary | ICD-10-CM

## 2016-04-20 DIAGNOSIS — Z3202 Encounter for pregnancy test, result negative: Secondary | ICD-10-CM

## 2016-04-20 LAB — POCT URINE PREGNANCY: PREG TEST UR: NEGATIVE

## 2016-04-20 MED ORDER — METRONIDAZOLE 500 MG PO TABS: 500.0000 mg | ORAL_TABLET | Freq: Two times a day (BID) | ORAL | 1 refills | Status: DC

## 2016-04-20 MED ORDER — METRONIDAZOLE 0.75 % VA GEL: 1.0000 | Freq: Every day | VAGINAL | 1 refills | Status: DC

## 2016-04-20 NOTE — Addendum Note (Signed)
Addended by: Moss McRESENZO, LATISHA M on: 04/20/2016 03:56 PM   Modules accepted: Orders

## 2016-04-20 NOTE — Patient Instructions (Signed)
Contraceptive Implant Information A contraceptive implant is a plastic rod that is inserted under your skin. It is usually inserted under the skin of your upper arm. It continually releases small amounts of progestin (synthetic progesterone) into your bloodstream. This prevents an egg from being released from your ovaries. It also thickens your cervical mucus to prevent sperm from entering the cervix, and it thins your uterine lining to prevent a fertilized egg from attaching to your uterus. Contraceptive implants can be effective for up to 3 years. They do not provide protection against sexually transmitted diseases (STDs).  The procedure to insert an implant usually takes about 10 minutes. There may be minor bruising, swelling, and discomfort at the insertion site for a couple days. The implant begins to work within the first day. Other contraceptive protection may be necessary for 7 days. Be sure to discuss with your health care provider if you need a backup method of contraception.  Your health care provider will make sure you are a good candidate for the contraceptive implant. Discuss with your health care provider the possible side effects of the implant. ADVANTAGES  It prevents pregnancy for up to 3 years.  It is easily reversible.  It is convenient.  It can be used when breastfeeding.  It can be used by women who cannot take estrogen. DISADVANTAGES  You may have irregular or unplanned vaginal bleeding.  You may develop side effects, including headache, weight gain, acne, breast tenderness, or mood changes.  You may have tissue or nerve damage after insertion (rare).  It may be difficult and uncomfortable to remove.  Certain medicines may interfere with the effectiveness of the implant. REMOVAL OF IMPLANT The implant should be removed in 3 years or as directed by your health care provider. The implant's effect wears off in a few hours after removal. Your ability to get pregnant  (fertility) may be restored in 1-2 weeks. A new implant can be inserted as soon as the old one is removed if desired. CONTRAINDICATIONS You should not get the implant if you are experiencing any of the following situations:  You are pregnant.  You have a history of breast cancer, osteoporosis, blood clots, heart disease, diabetes, high blood pressure, liver disease, tumors, or stroke.   You have undiagnosed vaginal bleeding.  You have a sensitivity to any part of the implant. This information is not intended to replace advice given to you by your health care provider. Make sure you discuss any questions you have with your health care provider. Document Released: 03/12/2011 Document Revised: 11/23/2012 Document Reviewed: 09/19/2012 Elsevier Interactive Patient Education  2017 Elsevier Inc.  

## 2016-04-20 NOTE — Progress Notes (Signed)
Patient ID: Colleen Hamilton, female   DOB: [birth year], 26 y.o.   MRN: [id number]    [facility]  @DATE @            Patient name: Colleen Hamilton MRN [id number]  Date of birth: [birth year]  CC & HPI:   Chief Complaint  Patient presents with  . Contraception    birth control pills, burning, itching     Colleen Hamilton is a 26 y.o. female G3P1 presenting today for vaginal burning, itching and odor. Pt had induced abortion in early December 2017 at 16w. Per pt, there was a period of time after this procedure that she was having normal discharge prior to the onset of her symptoms. She seen in the ED for similar symptoms on 03/16/16, was dx with BV and treated with flagyl. She was also treated empirically for GC/CHL with rocephin and Zithromax. GC/CHL was negative at this time. She states she has since slept with the same partner and her symptoms have recurred. She states her symptoms are similar to prior trichomoniasis infection. LMP 04/06/16. No h/o DM.   Pt would also like to be started on oral contraceptives. She has never taken any contraceptives in the past aside from using condoms.   ROS:  ROS +vaginal odor, burning and itching   Pertinent History Reviewed:   Reviewed: Significant for induced abortion  Medical         Past Medical History:  Diagnosis Date  . Medical history non-contributory                               Surgical Hx:    Past Surgical History:  Procedure Laterality Date  . INDUCED ABORTION     Medications: Reviewed & Updated - see associated section                      No current outpatient prescriptions on file.   Social History: Reviewed -  reports that she has quit smoking. She has quit using smokeless tobacco.  Objective Findings:  Vitals: Blood pressure 101/60, pulse 85, weight 187 lb (84.8 kg), last menstrual period 04/05/2016, unknown if currently breastfeeding.  Physical Examination: General appearance - alert, well appearing, and in no  distress Mental status - alert, oriented to person, place, and time Abdomen - soft, nontender, nondistended, no masses or organomegaly Pelvic -  VULVA: normal appearing vulva with no masses, tenderness or lesions,  VAGINA: normal appearing vagina with normal color and discharge, no lesions, normal appearing discharge  CERVIX: normal appearing cervix without discharge or lesions,   Wet prep collected:  Normal epithelials, no tric, relatively small clue cell count.  KOH collected: positive whiff test  GC/CHL collected  Assessment & Plan:   A:  1. Haemophilus vaginalis, recurrent   P:  1. Rx Metrogel (to be used monthly after period) and Flagyl 7 days 2. Pt to schedule appt last Monday of month for Nexplanon insertion, information provided in AVS    By signing my name below, I, Doreatha MartinEva Mathews, attest that this documentation has been prepared under the direction and in the presence of Tilda BurrowJohn V Venola Castello. Electronically Signed: Doreatha MartinEva Mathews, ED Scribe. 04/20/16. 2:20 PM.  I personally performed the services described in this documentation, which was SCRIBED in my presence. The recorded information has been reviewed and considered accurate. It has been edited as necessary during review. Tilda BurrowFERGUSON,Rosezetta Balderston V, MD

## 2016-04-22 LAB — GC/CHLAMYDIA PROBE AMP
CHLAMYDIA, DNA PROBE: NEGATIVE
NEISSERIA GONORRHOEAE BY PCR: NEGATIVE

## 2016-05-04 ENCOUNTER — Encounter: Payer: Medicaid Other | Admitting: Obstetrics and Gynecology

## 2016-05-06 ENCOUNTER — Encounter: Payer: Self-pay | Admitting: Obstetrics and Gynecology

## 2016-05-06 ENCOUNTER — Encounter: Payer: Medicaid Other | Admitting: Obstetrics and Gynecology

## 2016-06-18 ENCOUNTER — Encounter (HOSPITAL_COMMUNITY): Payer: Self-pay

## 2016-06-18 ENCOUNTER — Ambulatory Visit (HOSPITAL_COMMUNITY)
Admission: EM | Admit: 2016-06-18 | Discharge: 2016-06-18 | Disposition: A | Discharge status: Home / Self Care | Payer: Medicaid Other | Attending: Internal Medicine | Admitting: Internal Medicine

## 2016-06-18 DIAGNOSIS — R197 Diarrhea, unspecified: Secondary | ICD-10-CM | POA: Diagnosis not present

## 2016-06-18 DIAGNOSIS — R1084 Generalized abdominal pain: Secondary | ICD-10-CM

## 2016-06-18 DIAGNOSIS — A084 Viral intestinal infection, unspecified: Secondary | ICD-10-CM

## 2016-06-18 DIAGNOSIS — R112 Nausea with vomiting, unspecified: Secondary | ICD-10-CM

## 2016-06-18 MED ORDER — ONDANSETRON HCL 4 MG PO TABS: 4.0000 mg | ORAL_TABLET | Freq: Four times a day (QID) | ORAL | 0 refills | Status: DC

## 2016-06-18 NOTE — ED Triage Notes (Signed)
[facility] of [facility], nausea, diarrhea since last night 06/17/2016, possible stomach virus, pt states her daughter recently has a stomach virus

## 2016-06-18 NOTE — Discharge Instructions (Signed)
Your symptoms are likely caused by virus which will last anywhere between one and half to 3 days. Take the Zofran to decrease her nausea and vomiting in start to drink clear liquids in small frequent amounts. Obtain Pedialyte to replace liquids and electrolytes lost. May also take Tylenol as needed. May take Imodium A-D 1 tablet after he had a couple of diarrhea stools. If she continued to have diarrhea stools and 2-3 hours frequently you can take another one. Chest do not take some much that it stops the diarrhea, this can make you worse. If you are unable to take and any fluids without vomiting over the next day or 2 then you will likely need to seek medical attention for IV fluids. No milk fruit or solid foods for about 18-24 hours.

## 2016-06-18 NOTE — ED Provider Notes (Signed)
CSN: [id number]     Arrival date & time 06/18/16  1850 History   First MD Initiated Contact with Patient 06/18/16 1957     Chief Complaint  Patient presents with  . Emesis   (Consider location/radiation/quality/duration/timing/severity/associated sxs/prior Treatment) 26 year old female complaining of nausea, vomiting and diarrhea that started yesterday evening. She said 3 episodes of vomiting in total and approximate 5-6 episodes of diarrhea. His also complaining of general abdominal pain. Denies fever or chills although she has felt hot.      Past Medical History:  Diagnosis Date  . Medical history non-contributory    Past Surgical History:  Procedure Laterality Date  . INDUCED ABORTION     Family History  Problem Relation Age of Onset  . Hypertension Mother    Social History  Substance Use Topics  . Smoking status: Former Games developermoker  . Smokeless tobacco: Former NeurosurgeonUser  . Alcohol use No     Comment: rarely   OB History    Gravida Para Term Preterm AB Living   3 1 0 0 1 1   SAB TAB Ectopic Multiple Live Births     1 0 0 1     Review of Systems  Constitutional: Positive for activity change and appetite change. Negative for chills and fever.  HENT: Negative.   Respiratory: Negative.   Gastrointestinal: Positive for abdominal pain, diarrhea, nausea and vomiting.  Genitourinary: Negative.   Neurological: Negative.   All other systems reviewed and are negative.   Allergies  Patient has no known allergies.  Home Medications   Prior to Admission medications   Medication Sig Start Date End Date Taking? Authorizing Provider  metroNIDAZOLE (FLAGYL) 500 MG tablet Take 1 tablet (500 mg total) by mouth 2 (two) times daily. 04/20/16   Tilda BurrowJohn Ferguson V, MD  metroNIDAZOLE (METROGEL) 0.75 % vaginal gel Place 1 Applicatorful vaginally at bedtime. Apply one applicatorful to vagina at bedtime once monthly. 04/20/16   Tilda BurrowJohn Ferguson V, MD  ondansetron (ZOFRAN) 4 MG tablet Take 1 tablet (4  mg total) by mouth every 6 (six) hours. 06/18/16   Hayden Rasmussenavid Elan Mcelvain, NP   Meds Ordered and Administered this Visit  Medications - No data to display  BP 104/69 (BP Location: Left Arm)   Pulse 87   Temp 98.7 F (37.1 C) (Oral)   Resp 14   LMP 06/01/2016 (Exact Date)   SpO2 97%  No data found.   Physical Exam  Constitutional: She is oriented to person, place, and time. She appears well-developed and well-nourished. No distress.  Neck: Neck supple.  Cardiovascular: Normal rate, regular rhythm and normal heart sounds.   Pulmonary/Chest: Effort normal. No respiratory distress.  Abdominal: Soft. Bowel sounds are normal. She exhibits no distension and no mass. There is tenderness. There is no rebound and no guarding.  Mild tenderness in the epigastrium and periumbilical area. Lesser tenderness across the lower abdomen.  Musculoskeletal: She exhibits no edema.  Neurological: She is alert and oriented to person, place, and time.  Skin: Skin is warm and dry.  Psychiatric: She has a normal mood and affect.  Nursing note and vitals reviewed.   Urgent Care Course     Procedures (including critical care time)  Labs Review Labs Reviewed - No data to display  Imaging Review No results found.   Visual Acuity Review  Right Eye Distance:   Left Eye Distance:   Bilateral Distance:    Right Eye Near:   Left Eye Near:    Bilateral  Near:         MDM   1. Viral gastroenteritis   2. Nausea vomiting and diarrhea   3. Generalized abdominal pain    Your symptoms are likely caused by virus which will last anywhere between one and half to 3 days. Take the Zofran to decrease her nausea and vomiting in start to drink clear liquids in small frequent amounts. Obtain Pedialyte to replace liquids and electrolytes lost. May also take Tylenol as needed. May take Imodium A-D 1 tablet after he had a couple of diarrhea stools. If she continued to have diarrhea stools and 2-3 hours frequently you can  take another one. Chest do not take some much that it stops the diarrhea, this can make you worse. If you are unable to take and any fluids without vomiting over the next day or 2 then you will likely need to seek medical attention for IV fluids. No milk fruit or solid foods for about 18-24 hours. Meds ordered this encounter  Medications  . ondansetron (ZOFRAN) 4 MG tablet    Sig: Take 1 tablet (4 mg total) by mouth every 6 (six) hours.    Dispense:  12 tablet    Refill:  0    Order Specific Question:   Supervising Provider    Answer:   Eustace Moore [[id number]]       Hayden Rasmussen, NP 06/18/16 2011

## 2016-07-15 ENCOUNTER — Other Ambulatory Visit (HOSPITAL_COMMUNITY)
Admission: RE | Admit: 2016-07-15 | Discharge: 2016-07-15 | Disposition: A | Discharge status: Home / Self Care | Payer: Medicaid Other | Source: Ambulatory Visit | Attending: Obstetrics and Gynecology | Admitting: Obstetrics and Gynecology

## 2016-07-15 ENCOUNTER — Encounter: Payer: Self-pay | Admitting: Adult Health

## 2016-07-15 ENCOUNTER — Ambulatory Visit (INDEPENDENT_AMBULATORY_CARE_PROVIDER_SITE_OTHER): Payer: Medicaid Other | Admitting: Adult Health

## 2016-07-15 VITALS — BP 100/70 | HR 80 | Ht 64.0 in | Wt 188.0 lb

## 2016-07-15 DIAGNOSIS — Z3202 Encounter for pregnancy test, result negative: Secondary | ICD-10-CM

## 2016-07-15 DIAGNOSIS — Z3009 Encounter for other general counseling and advice on contraception: Secondary | ICD-10-CM

## 2016-07-15 DIAGNOSIS — Z0000 Encounter for general adult medical examination without abnormal findings: Secondary | ICD-10-CM

## 2016-07-15 DIAGNOSIS — Z01419 Encounter for gynecological examination (general) (routine) without abnormal findings: Secondary | ICD-10-CM

## 2016-07-15 DIAGNOSIS — Z30011 Encounter for initial prescription of contraceptive pills: Secondary | ICD-10-CM

## 2016-07-15 DIAGNOSIS — Z113 Encounter for screening for infections with a predominantly sexual mode of transmission: Secondary | ICD-10-CM | POA: Insufficient documentation

## 2016-07-15 LAB — POCT URINE PREGNANCY: PREG TEST UR: NEGATIVE

## 2016-07-15 MED ORDER — NORETHIN-ETH ESTRAD-FE BIPHAS 1 MG-10 MCG / 10 MCG PO TABS
1.0000 | ORAL_TABLET | Freq: Every day | ORAL | 3 refills | Status: DC
Start: 2016-07-15 — End: 2016-11-06

## 2016-07-15 NOTE — Progress Notes (Signed)
Patient ID: Colleen Hamilton, female   DOB: 1990/09/16, 26 y.o.   MRN: [id number] History of Present Illness: Colleen Hamilton is a 26 year old black female in for well woman gyn exam and pap.She wants to get on birth control ASAP, had unprotected sex last week and took Plan B.  She has el ab in December.  Current Medications, Allergies, Past Medical History, Past Surgical History, Family History and Social History were reviewed in [employer] record.     Review of Systems: Patient denies any headaches, hearing loss, fatigue, blurred vision, shortness of breath, chest pain, abdominal pain, problems with bowel movements, urination, or intercourse. No joint pain or mood swings.Vaginal irritation    Physical Exam:BP 100/70 (BP Location: Right Arm, Patient Position: Sitting, Cuff Size: Small)   Pulse 80   Ht  (1.626 m)   Wt 188 lb (85.3 kg)   LMP 07/01/2016   BMI 32.27 kg/m UPT negative. General:  Well developed, well nourished, no acute distress Skin:  Warm and dry Neck:  Midline trachea, normal thyroid, good ROM, no lymphadenopathy Lungs; Clear to auscultation bilaterally Breast:  No dominant palpable mass, retraction, or nipple discharge Cardiovascular: Regular rate and rhythm Abdomen:  Soft, non tender, no hepatosplenomegaly Pelvic:  External genitalia is normal in appearance, no lesions.  The vagina is normal in appearance. Urethra has no lesions or masses. The cervix is bulbous.Pap with GC/CHL and reflex HPV performed.  Uterus is felt to be normal size, shape, and contour.  No adnexal masses or tenderness noted.Bladder is non tender, no masses felt. Extremities/musculoskeletal:  No swelling or varicosities noted, no clubbing or cyanosis Psych:  No mood changes, alert and cooperative,seems happy PHQ 2 score 0.Stop THC and she says she has.Discussed OCs,depo, IUD. Ring and nexplanon. Will start OCs today and get back in when on period for nexplanon insertion.She says  she has not had labs in long time, will get labs today.  Impression: 1. Encounter for routine gynecological examination with Papanicolaou smear of cervix   2. Screening for STD (sexually transmitted disease)   3. Contraceptive education   4. Encounter for initial prescription of contraceptive pills   5. Negative pregnancy test       Plan:  Check CBC,CMP,TSH and lipids,HIV and RPR Meds ordered this encounter  Medications  . Norethindrone-Ethinyl Estradiol-Fe Biphas (LO LOESTRIN FE) 1 MG-10 MCG / 10 MCG tablet    Sig: Take 1 tablet by mouth daily. Take 1 daily by mouth    Dispense:  1 Package    Refill:  3    BIN [id number], PCN [id number], GRP [id number]    Order Specific Question:   Supervising Provider    Answer:   Lazaro Arms [[id number]]  Start OCs today, use condoms Return 6/4 for nexplanon insertion (nexplanon has been ordered and is here) Physical in 1 year Pap in 3 if normal

## 2016-07-15 NOTE — Patient Instructions (Signed)
Start OCs today Use condoms  

## 2016-07-16 ENCOUNTER — Telehealth: Payer: Self-pay | Admitting: Adult Health

## 2016-07-16 ENCOUNTER — Other Ambulatory Visit: Payer: Medicaid Other | Admitting: Obstetrics and Gynecology

## 2016-07-16 LAB — CBC
HEMOGLOBIN: 13.6 g/dL (ref 11.1–15.9)
Hematocrit: 41.8 % (ref 34.0–46.6)
MCH: 29.2 pg (ref 26.6–33.0)
MCHC: 32.5 g/dL (ref 31.5–35.7)
MCV: 90 fL (ref 79–97)
PLATELETS: 210 10*3/uL (ref 150–379)
RBC: 4.66 x10E6/uL (ref 3.77–5.28)
RDW: 14 % (ref 12.3–15.4)
WBC: 8.7 10*3/uL (ref 3.4–10.8)

## 2016-07-16 LAB — COMPREHENSIVE METABOLIC PANEL
ALBUMIN: 4.3 g/dL (ref 3.5–5.5)
ALK PHOS: 54 IU/L (ref 39–117)
ALT: 14 IU/L (ref 0–32)
AST: 18 IU/L (ref 0–40)
Albumin/Globulin Ratio: 1.5 (ref 1.2–2.2)
BILIRUBIN TOTAL: 0.4 mg/dL (ref 0.0–1.2)
BUN / CREAT RATIO: 22 (ref 9–23)
BUN: 14 mg/dL (ref 6–20)
CHLORIDE: 100 mmol/L (ref 96–106)
CO2: 23 mmol/L (ref 18–29)
CREATININE: 0.65 mg/dL (ref 0.57–1.00)
Calcium: 9.4 mg/dL (ref 8.7–10.2)
GFR calc Af Amer: 144 mL/min/{1.73_m2} (ref >59)
GFR calc non Af Amer: 125 mL/min/{1.73_m2} (ref >59)
GLUCOSE: 89 mg/dL (ref 65–99)
Globulin, Total: 2.8 g/dL (ref 1.5–4.5)
Potassium: 4.3 mmol/L (ref 3.5–5.2)
Sodium: 142 mmol/L (ref 134–144)
Total Protein: 7.1 g/dL (ref 6.0–8.5)

## 2016-07-16 LAB — LIPID PANEL
CHOLESTEROL TOTAL: 155 mg/dL (ref 100–199)
Chol/HDL Ratio: 2.6 ratio (ref 0.0–4.4)
HDL: 60 mg/dL (ref >39)
LDL Calculated: 87 mg/dL (ref 0–99)
TRIGLYCERIDES: 39 mg/dL (ref 0–149)
VLDL CHOLESTEROL CAL: 8 mg/dL (ref 5–40)

## 2016-07-16 LAB — TSH: TSH: 0.842 u[IU]/mL (ref 0.450–4.500)

## 2016-07-16 LAB — HIV ANTIBODY (ROUTINE TESTING W REFLEX): HIV SCREEN 4TH GENERATION: NONREACTIVE

## 2016-07-16 LAB — RPR: RPR Ser Ql: NONREACTIVE

## 2016-07-16 NOTE — Telephone Encounter (Signed)
Pt aware labs are excellent 

## 2016-07-20 LAB — CYTOLOGY - PAP
Adequacy: ABSENT
Chlamydia: NEGATIVE
DIAGNOSIS: NEGATIVE
HPV: DETECTED — AB
NEISSERIA GONORRHEA: NEGATIVE

## 2016-07-21 ENCOUNTER — Encounter (HOSPITAL_COMMUNITY): Payer: Self-pay | Admitting: Emergency Medicine

## 2016-07-21 ENCOUNTER — Telehealth: Payer: Self-pay | Admitting: Adult Health

## 2016-07-21 ENCOUNTER — Encounter: Payer: Self-pay | Admitting: Adult Health

## 2016-07-21 ENCOUNTER — Emergency Department (HOSPITAL_COMMUNITY)
Admission: EM | Admit: 2016-07-21 | Discharge: 2016-07-21 | Disposition: A | Discharge status: Home / Self Care | Payer: Medicaid Other | Attending: Emergency Medicine | Admitting: Emergency Medicine

## 2016-07-21 DIAGNOSIS — R87810 Cervical high risk human papillomavirus (HPV) DNA test positive: Secondary | ICD-10-CM

## 2016-07-21 DIAGNOSIS — Z87891 Personal history of nicotine dependence: Secondary | ICD-10-CM | POA: Insufficient documentation

## 2016-07-21 DIAGNOSIS — N3001 Acute cystitis with hematuria: Secondary | ICD-10-CM | POA: Diagnosis not present

## 2016-07-21 DIAGNOSIS — R300 Dysuria: Secondary | ICD-10-CM | POA: Diagnosis present

## 2016-07-21 HISTORY — DX: Cervical high risk human papillomavirus (HPV) DNA test positive: R87.810

## 2016-07-21 LAB — URINALYSIS, ROUTINE W REFLEX MICROSCOPIC
Bilirubin Urine: NEGATIVE
Glucose, UA: NEGATIVE mg/dL
Ketones, ur: NEGATIVE mg/dL
NITRITE: NEGATIVE
PROTEIN: 100 mg/dL — AB
SPECIFIC GRAVITY, URINE: 1.026 (ref 1.005–1.030)
pH: 6 (ref 5.0–8.0)

## 2016-07-21 LAB — POC URINE PREG, ED: PREG TEST UR: NEGATIVE

## 2016-07-21 MED ORDER — PHENAZOPYRIDINE HCL 200 MG PO TABS: 200.0000 mg | ORAL_TABLET | Freq: Three times a day (TID) | ORAL | 0 refills | Status: DC

## 2016-07-21 MED ORDER — CEPHALEXIN 500 MG PO CAPS
500.0000 mg | ORAL_CAPSULE | Freq: Once | ORAL | Status: AC
  Administered 2016-07-21: 500 mg via ORAL
  Filled 2016-07-21: qty 1

## 2016-07-21 MED ORDER — CEPHALEXIN 500 MG PO CAPS: 500.0000 mg | ORAL_CAPSULE | Freq: Three times a day (TID) | ORAL | 0 refills | Status: DC

## 2016-07-21 MED ORDER — PHENAZOPYRIDINE HCL 100 MG PO TABS
100.0000 mg | ORAL_TABLET | Freq: Once | ORAL | Status: AC
  Administered 2016-07-21: 100 mg via ORAL
  Filled 2016-07-21: qty 1

## 2016-07-21 MED ORDER — FLUCONAZOLE 150 MG PO TABS: 150.0000 mg | ORAL_TABLET | Freq: Once | ORAL | 0 refills | Status: AC

## 2016-07-21 NOTE — ED Triage Notes (Signed)
Pt c/o urinary frequency, hematuria, and dysuria since yesterday.

## 2016-07-21 NOTE — ED Provider Notes (Signed)
[facility] Provider Note   CSN: [id number] Arrival date & time: 07/21/16  2015     History   Chief Complaint Chief Complaint  Patient presents with  . Dysuria    HPI Colleen Hamilton is a 26 y.o. female.  Colleen Hamilton is a 26 y.o. Female who presents to the emergency department complaining of urinary frequency, hematuria and dysuria since yesterday. Colleen Hamilton tells me Colleen Hamilton believes Colleen Hamilton has a urinary tract infection. Colleen Hamilton denies recent urinary tract infections. Colleen Hamilton denies vaginal bleeding or vaginal discharge. No back pain or flank pain. Colleen Hamilton denies abdominal pain, nausea, vomiting or diarrhea. Colleen Hamilton is taking Tylenol today with little relief of her symptoms. Last menstrual cycle was 07/01/2016.   The history is provided by the patient and medical records. No language interpreter was used.  Dysuria   Associated symptoms include frequency, hematuria and urgency. Pertinent negatives include no chills, no nausea, no vomiting and no flank pain.    Past Medical History:  Diagnosis Date  . Cervical high risk human papillomavirus (HPV) DNA test positive 07/21/2016  . Medical history non-contributory     Patient Active Problem List   Diagnosis Date Noted  . Cervical high risk human papillomavirus (HPV) DNA test positive 07/21/2016  . Encounter for routine gynecological examination with Papanicolaou smear of cervix 07/15/2016  . Screening for STD (sexually transmitted disease) 07/15/2016  . Encounter for initial prescription of contraceptive pills 07/15/2016  . Contraceptive education 07/15/2016    Past Surgical History:  Procedure Laterality Date  . INDUCED ABORTION      OB History    Gravida Para Term Preterm AB Living   3 1 0 0 1 1   SAB TAB Ectopic Multiple Live Births     1 0 0 1       Home Medications    Prior to Admission medications   Medication Sig Start Date End Date Taking? Authorizing Provider  cephALEXin (KEFLEX) 500 MG capsule Take 1 capsule (500 mg  total) by mouth 3 (three) times daily. 07/21/16   Everlene Farrier, PA-C  fluconazole (DIFLUCAN) 150 MG tablet Take 1 tablet (150 mg total) by mouth once. 07/21/16 07/21/16  Everlene Farrier, PA-C  Norethindrone-Ethinyl Estradiol-Fe Biphas (LO LOESTRIN FE) 1 MG-10 MCG / 10 MCG tablet Take 1 tablet by mouth daily. Take 1 daily by mouth 07/15/16   Adline Potter, NP  phenazopyridine (PYRIDIUM) 200 MG tablet Take 1 tablet (200 mg total) by mouth 3 (three) times daily. 07/21/16   Everlene Farrier, PA-C    Family History Family History  Problem Relation Age of Onset  . Hypertension Mother     Social History Social History  Substance Use Topics  . Smoking status: Former Games developer  . Smokeless tobacco: Former Neurosurgeon  . Alcohol use No     Comment: rarely     Allergies   Patient has no known allergies.   Review of Systems Review of Systems  Constitutional: Negative for chills and fever.  HENT: Negative for mouth sores.   Eyes: Negative for visual disturbance.  Respiratory: Negative for cough and shortness of breath.   Cardiovascular: Negative for chest pain.  Gastrointestinal: Negative for abdominal pain, diarrhea, nausea and vomiting.  Genitourinary: Positive for dysuria, frequency, hematuria and urgency. Negative for decreased urine volume, difficulty urinating, flank pain, genital sores, menstrual problem, pelvic pain, vaginal bleeding, vaginal discharge and vaginal pain.  Musculoskeletal: Negative for back pain.  Skin: Negative for rash and wound.  Neurological: Negative for syncope.  Physical Exam Updated Vital Signs BP 106/90   Pulse 78   Temp 98.5 F (36.9 C)   Resp 18   Ht  (1.626 m)   Wt 85.3 kg   LMP 07/01/2016   SpO2 99%   BMI 32.27 kg/m   Physical Exam  Constitutional: Colleen Hamilton appears well-developed and well-nourished. No distress.  Nontoxic appearing.  HENT:  Head: Normocephalic and atraumatic.  Mouth/Throat: Oropharynx is clear and moist.  Eyes: Right eye  exhibits no discharge. Left eye exhibits no discharge.  Neck: Neck supple.  Cardiovascular: Normal rate, regular rhythm, normal heart sounds and intact distal pulses.   Pulmonary/Chest: Effort normal and breath sounds normal. No respiratory distress.  Abdominal: Soft. Bowel sounds are normal. Colleen Hamilton exhibits no distension and no mass. There is tenderness. There is no rebound and no guarding.  Abdomen is soft. Bowel sounds are present. Patient has mild suprapubic abdominal tenderness to palpation. No peritoneal signs. No CVA or flank tenderness.  Musculoskeletal: Colleen Hamilton exhibits no edema.  Neurological: Colleen Hamilton is alert. Coordination normal.  Skin: Skin is warm and dry. No rash noted. Colleen Hamilton is not diaphoretic. No erythema. No pallor.  Psychiatric: Colleen Hamilton has a normal mood and affect. Her behavior is normal.  Nursing note and vitals reviewed.    ED Treatments / Results  Labs (all labs ordered are listed, but only abnormal results are displayed) Labs Reviewed  URINALYSIS, ROUTINE W REFLEX MICROSCOPIC - Abnormal; Notable for the following:       Result Value   APPearance CLOUDY (*)    Hgb urine dipstick LARGE (*)    Protein, ur 100 (*)    Leukocytes, UA MODERATE (*)    Bacteria, UA MANY (*)    Squamous Epithelial / LPF 0-5 (*)    Non Squamous Epithelial 0-5 (*)    All other components within normal limits  URINE CULTURE  POC URINE PREG, ED    EKG  EKG Interpretation None       Radiology No results found.  Procedures Procedures (including critical care time)  Medications Ordered in ED Medications  cephALEXin (KEFLEX) capsule 500 mg (500 mg Oral Given 07/21/16 2124)  phenazopyridine (PYRIDIUM) tablet 100 mg (100 mg Oral Given 07/21/16 2124)     Initial Impression / Assessment and Plan / ED Course  I have reviewed the triage vital signs and the nursing notes.  Pertinent labs & imaging results that were available during my care of the patient were reviewed by me and considered in my  medical decision making (see chart for details).    This is a 26 y.o. Female who presents to the emergency department complaining of urinary frequency, hematuria and dysuria since yesterday. Colleen Hamilton tells me Colleen Hamilton believes Colleen Hamilton has a urinary tract infection. Colleen Hamilton denies recent urinary tract infections. Colleen Hamilton denies vaginal bleeding or vaginal discharge. No back pain or flank pain. Colleen Hamilton denies abdominal pain, nausea, vomiting or diarrhea. On exam the patient is afebrile nontoxic appearing. Her abdomen is soft and Colleen Hamilton has mild suprapubic abdominal tenderness to palpation. No peritoneal signs. No CVA or flank tenderness. Pregnancy test is negative. Urinalysis is nitrite negative with moderate leukocytes and too numerous to count white blood cells and many bacteria. Urine sent for culture. We'll discharge with Keflex. I discussed return precautions. I advised the patient to follow-up with their primary care provider this week. I advised the patient to return to the emergency department with new or worsening symptoms or new concerns. The patient verbalized understanding and agreement  with plan.     Final Clinical Impressions(s) / ED Diagnoses   Final diagnoses:  Acute cystitis with hematuria    New Prescriptions New Prescriptions   CEPHALEXIN (KEFLEX) 500 MG CAPSULE    Take 1 capsule (500 mg total) by mouth 3 (three) times daily.   FLUCONAZOLE (DIFLUCAN) 150 MG TABLET    Take 1 tablet (150 mg total) by mouth once.   PHENAZOPYRIDINE (PYRIDIUM) 200 MG TABLET    Take 1 tablet (200 mg total) by mouth 3 (three) times daily.     Everlene Farrier, PA-C 07/21/16 2129    Eber Hong, MD 07/23/16 [phone]

## 2016-07-21 NOTE — Telephone Encounter (Signed)
Pt aware pap negative for malignancy, and GC/CHL but +HPV, use condoms and repeat pap in 1 year

## 2016-07-24 LAB — URINE CULTURE

## 2016-07-25 ENCOUNTER — Telehealth: Payer: Self-pay

## 2016-07-25 NOTE — Telephone Encounter (Signed)
Post ED Visit - Positive Culture Follow-up  Culture report reviewed by antimicrobial stewardship pharmacist:   Enzo Bi, Pharm.D.  Celedonio Miyamoto, Pharm.D., BCPS AQ-ID  Garvin Fila, Pharm.D., BCPS  Georgina Pillion, Pharm.D., BCPS  [place], [address].D., BCPS, AAHIVP  Estella Husk, Pharm.D., BCPS, AAHIVP  Lysle Pearl, PharmD, BCPS  Casilda Carls, PharmD, BCPS  Pollyann Samples, PharmD, BCPS  Positive urine culture Treated with Cephalexin, organism sensitive to the same and no further patient follow-up is required at this time.  Jerry Caras 07/25/2016, 10:23 AM

## 2016-08-13 ENCOUNTER — Encounter (HOSPITAL_COMMUNITY): Payer: Self-pay | Admitting: *Deleted

## 2016-08-13 ENCOUNTER — Emergency Department (HOSPITAL_COMMUNITY)
Admission: EM | Admit: 2016-08-13 | Discharge: 2016-08-13 | Disposition: A | Discharge status: Home / Self Care | Payer: Medicaid Other | Attending: Emergency Medicine | Admitting: Emergency Medicine

## 2016-08-13 DIAGNOSIS — Z87891 Personal history of nicotine dependence: Secondary | ICD-10-CM | POA: Insufficient documentation

## 2016-08-13 DIAGNOSIS — Z3202 Encounter for pregnancy test, result negative: Secondary | ICD-10-CM | POA: Insufficient documentation

## 2016-08-13 LAB — PREGNANCY, URINE: PREG TEST UR: NEGATIVE

## 2016-08-13 NOTE — Discharge Instructions (Signed)
Your vital signs are well within normal limits. Your pregnancy test is negative. Please see Dr. Emelda FearFerguson, or member of his team for additional evaluation and testing if any changes or concerns.

## 2016-08-13 NOTE — ED Triage Notes (Signed)
Pt here to get pregnancy test; pt states she her last period was in march; pt denies any cramping or abdominal pain

## 2016-08-13 NOTE — ED Provider Notes (Signed)
[facility] Provider Note   CSN: [id number] Arrival date & time: 08/13/16  2011     History   Chief Complaint Chief Complaint  Patient presents with  . Possible Pregnancy    HPI Colleen Hamilton is a 26 y.o. female.  Patient is a 26 year old female who presents to the emergency department with a request for a pregnancy test.  The patient states that she is a week late for her menstrual cycle. She is concerned for possible pregnancy. She has not used an over-the-counter pregnancy test. She has not had any excessive nausea or vomiting. No unusual headache. No abdominal pain. No back pain reported. She was recently treated for some vaginal discharges. She has finished her medications for these. She has had an induced abortion in the past.      Past Medical History:  Diagnosis Date  . Cervical high risk human papillomavirus (HPV) DNA test positive 07/21/2016  . Medical history non-contributory     Patient Active Problem List   Diagnosis Date Noted  . Cervical high risk human papillomavirus (HPV) DNA test positive 07/21/2016  . Encounter for routine gynecological examination with Papanicolaou smear of cervix 07/15/2016  . Screening for STD (sexually transmitted disease) 07/15/2016  . Encounter for initial prescription of contraceptive pills 07/15/2016  . Contraceptive education 07/15/2016    Past Surgical History:  Procedure Laterality Date  . INDUCED ABORTION      OB History    Gravida Para Term Preterm AB Living   3 1 0 0 1 1   SAB TAB Ectopic Multiple Live Births     1 0 0 1       Home Medications    Prior to Admission medications   Medication Sig Start Date End Date Taking? Authorizing Provider  cephALEXin (KEFLEX) 500 MG capsule Take 1 capsule (500 mg total) by mouth 3 (three) times daily. 07/21/16   Everlene Farrier, PA-C  Norethindrone-Ethinyl Estradiol-Fe Biphas (LO LOESTRIN FE) 1 MG-10 MCG / 10 MCG tablet Take 1 tablet by mouth daily. Take 1 daily  by mouth 07/15/16   Adline Potter, NP  phenazopyridine (PYRIDIUM) 200 MG tablet Take 1 tablet (200 mg total) by mouth 3 (three) times daily. 07/21/16   Everlene Farrier, PA-C    Family History Family History  Problem Relation Age of Onset  . Hypertension Mother     Social History Social History  Substance Use Topics  . Smoking status: Former Games developer  . Smokeless tobacco: Former Neurosurgeon  . Alcohol use No     Comment: rarely     Allergies   Patient has no known allergies.   Review of Systems Review of Systems  Constitutional: Negative for activity change.       All ROS Neg except as noted in HPI  HENT: Negative for nosebleeds.   Eyes: Negative for photophobia and discharge.  Respiratory: Negative for cough, shortness of breath and wheezing.   Cardiovascular: Negative for chest pain and palpitations.  Gastrointestinal: Negative for abdominal pain, blood in stool, nausea and vomiting.  Genitourinary: Negative for dysuria, frequency, hematuria, vaginal bleeding and vaginal pain.  Musculoskeletal: Negative for arthralgias, back pain and neck pain.  Skin: Negative.   Neurological: Negative for dizziness, seizures and speech difficulty.  Psychiatric/Behavioral: Negative for confusion and hallucinations.     Physical Exam Updated Vital Signs BP 113/65 (BP Location: Left Arm)   Pulse 77   Temp 98 F (36.7 C) (Oral)   Resp 16   Ht 5\' 2"  (  1.575 m)   Wt 85.3 kg   LMP 06/27/2016   SpO2 100%   BMI 34.39 kg/m   Physical Exam  Constitutional: She is oriented to person, place, and time. She appears well-developed and well-nourished.  Non-toxic appearance.  HENT:  Head: Normocephalic.  Right Ear: Tympanic membrane and external ear normal.  Left Ear: Tympanic membrane and external ear normal.  Eyes: EOM and lids are normal. Pupils are equal, round, and reactive to light.  Neck: Normal range of motion. Neck supple. Carotid bruit is not present.  Cardiovascular: Normal rate,  regular rhythm, normal heart sounds, intact distal pulses and normal pulses.   Pulmonary/Chest: Breath sounds normal. No respiratory distress.  Abdominal: Soft. Bowel sounds are normal. There is no tenderness. There is no guarding.  Musculoskeletal: Normal range of motion.  Lymphadenopathy:       Head (right side): No submandibular adenopathy present.       Head (left side): No submandibular adenopathy present.    She has no cervical adenopathy.  Neurological: She is alert and oriented to person, place, and time. She has normal strength. No cranial nerve deficit or sensory deficit.  Skin: Skin is warm and dry.  Psychiatric: She has a normal mood and affect. Her speech is normal.  Nursing note and vitals reviewed.    ED Treatments / Results  Labs (all labs ordered are listed, but only abnormal results are displayed) Labs Reviewed  PREGNANCY, URINE    EKG  EKG Interpretation None       Radiology No results found.  Procedures Procedures (including critical care time)  Medications Ordered in ED Medications - No data to display   Initial Impression / Assessment and Plan / ED Course  I have reviewed the triage vital signs and the nursing notes.  Pertinent labs & imaging results that were available during my care of the patient were reviewed by me and considered in my medical decision making (see chart for details).       Final Clinical Impressions(s) / ED Diagnoses MDM Vital signs within normal limits. Pulse oximetry 100% on room air. Within normal limits by my interpretation. Patient presents requesting a urine pregnancy test as she is a week late on her menstrual cycle. Urine pregnancy test is negative. I discussed with the patient to see Dr. Emelda FearFerguson (GYN) for additional testing and evaluation if any symptoms, any changes, or problems. The patient acknowledges understanding of the discharge instructions.    Final diagnoses:  Encounter for pregnancy test with result  negative    New Prescriptions New Prescriptions   No medications on file     Ivery QualeBryant, Jaynee Winters, Cordelia Poche-C 08/13/16 2156    Samuel JesterMcManus, Kathleen, DO 08/16/16 1548

## 2016-08-13 NOTE — ED Notes (Signed)
ED Provider at bedside. 

## 2016-09-09 ENCOUNTER — Encounter: Payer: Medicaid Other | Admitting: Adult Health

## 2016-11-06 ENCOUNTER — Encounter (HOSPITAL_COMMUNITY): Payer: Self-pay | Admitting: Emergency Medicine

## 2016-11-06 ENCOUNTER — Emergency Department (HOSPITAL_COMMUNITY)
Admission: EM | Admit: 2016-11-06 | Discharge: 2016-11-06 | Disposition: A | Discharge status: Home / Self Care | Payer: Medicaid Other | Attending: Emergency Medicine | Admitting: Emergency Medicine

## 2016-11-06 DIAGNOSIS — N3001 Acute cystitis with hematuria: Secondary | ICD-10-CM | POA: Insufficient documentation

## 2016-11-06 DIAGNOSIS — F17210 Nicotine dependence, cigarettes, uncomplicated: Secondary | ICD-10-CM | POA: Insufficient documentation

## 2016-11-06 LAB — URINALYSIS, ROUTINE W REFLEX MICROSCOPIC
Bilirubin Urine: NEGATIVE
Glucose, UA: NEGATIVE mg/dL
KETONES UR: NEGATIVE mg/dL
Nitrite: NEGATIVE
PROTEIN: 30 mg/dL — AB
Specific Gravity, Urine: 1.014 (ref 1.005–1.030)
pH: 7 (ref 5.0–8.0)

## 2016-11-06 LAB — PREGNANCY, URINE: PREG TEST UR: NEGATIVE

## 2016-11-06 MED ORDER — CEPHALEXIN 500 MG PO CAPS
500.0000 mg | ORAL_CAPSULE | Freq: Once | ORAL | Status: AC
  Administered 2016-11-06: 500 mg via ORAL
  Filled 2016-11-06: qty 1

## 2016-11-06 MED ORDER — CEPHALEXIN 500 MG PO CAPS: 500.0000 mg | ORAL_CAPSULE | Freq: Four times a day (QID) | ORAL | 0 refills | Status: DC

## 2016-11-06 MED ORDER — PHENAZOPYRIDINE HCL 200 MG PO TABS: 200.0000 mg | ORAL_TABLET | Freq: Three times a day (TID) | ORAL | 0 refills | Status: DC

## 2016-11-06 MED ORDER — PHENAZOPYRIDINE HCL 100 MG PO TABS
200.0000 mg | ORAL_TABLET | Freq: Once | ORAL | Status: AC
  Administered 2016-11-06: 200 mg via ORAL
  Filled 2016-11-06: qty 2

## 2016-11-06 NOTE — ED Triage Notes (Signed)
Pt c/o back/abd pain with dysuria and hematuria since last night.

## 2016-11-06 NOTE — Discharge Instructions (Signed)
Drink plenty of water and cranberry juice.  Take the antibiotic as directed until its finished.  Return here for any worsening symptoms.  You can take ibuprofen every 6 hrs if needed for pain.

## 2016-11-06 NOTE — ED Triage Notes (Signed)
Hematuria since last night per pt report  Dr Emelda FearFerguson is PCP

## 2016-11-06 NOTE — ED Provider Notes (Signed)
[facility]-EMERGENCY DEPT Provider Note   CSN: [id number] Arrival date & time: 11/06/16  2213     History   Chief Complaint Chief Complaint  Patient presents with  . Dysuria    HPI Colleen Hamilton is a 26 y.o. female.  HPI   Colleen Hamilton is a 26 y.o. female who presents to the Emergency Department complaining of urinary frequency, burning and hematuria.  Symptoms began one day prior to arrival.  States she had similar symptoms several months ago and was diagnosed with a UTI.  She also describes a "pressure" sensation to her lower abdomen and lower right back.  Back pain associated with movement.  She denies fever, vomiting or nausea, chills, vaginal bleeding or discharge.  No thing makes the symptoms better or worse   Past Medical History:  Diagnosis Date  . Cervical high risk human papillomavirus (HPV) DNA test positive 07/21/2016  . Medical history non-contributory     Patient Active Problem List   Diagnosis Date Noted  . Cervical high risk human papillomavirus (HPV) DNA test positive 07/21/2016  . Encounter for routine gynecological examination with Papanicolaou smear of cervix 07/15/2016  . Screening for STD (sexually transmitted disease) 07/15/2016  . Encounter for initial prescription of contraceptive pills 07/15/2016  . Contraceptive education 07/15/2016    Past Surgical History:  Procedure Laterality Date  . INDUCED ABORTION      OB History    Gravida Para Term Preterm AB Living   3 1 0 0 1 1   SAB TAB Ectopic Multiple Live Births     1 0 0 1       Home Medications    Prior to Admission medications   Medication Sig Start Date End Date Taking? Authorizing Provider  ibuprofen (ADVIL,MOTRIN) 200 MG tablet Take 600 mg by mouth daily as needed for mild pain or moderate pain.   Yes [provider]  cephALEXin (KEFLEX) 500 MG capsule Take 1 capsule (500 mg total) by mouth 4 (four) times daily. For 7 days 11/06/16   Pauline Ausriplett, Eilyn Polack, PA-C  phenazopyridine  (PYRIDIUM) 200 MG tablet Take 1 tablet (200 mg total) by mouth 3 (three) times daily. 11/06/16   Pauline Ausriplett, Patria Warzecha, PA-C    Family History Family History  Problem Relation Age of Onset  . Hypertension Mother     Social History Social History  Substance Use Topics  . Smoking status: Current Every Day Smoker    Types: Cigarettes  . Smokeless tobacco: Former NeurosurgeonUser  . Alcohol use No     Comment: rarely     Allergies   Patient has no known allergies.   Review of Systems Review of Systems  Constitutional: Negative for activity change, appetite change, chills and fever.  Respiratory: Negative for chest tightness and shortness of breath.   Gastrointestinal: Positive for abdominal pain. Negative for nausea and vomiting.  Genitourinary: Positive for dysuria, frequency, hematuria and urgency. Negative for decreased urine volume, difficulty urinating, flank pain, vaginal bleeding and vaginal discharge.  Musculoskeletal: Positive for back pain.  Skin: Negative for rash.  Neurological: Negative for dizziness, weakness and numbness.  Hematological: Negative for adenopathy.  Psychiatric/Behavioral: Negative for confusion.  All other systems reviewed and are negative.    Physical Exam Updated Vital Signs BP (!) 109/43   Pulse 82   Temp 98.3 F (36.8 C)   Resp 18   Ht 5\' 3"  (1.6 m)   Wt 88 kg (194 lb)   LMP 10/22/2016   SpO2 100%  BMI 34.37 kg/m   Physical Exam  Constitutional: She is oriented to person, place, and time. She appears well-developed and well-nourished. No distress.  HENT:  Head: Normocephalic and atraumatic.  Mouth/Throat: Oropharynx is clear and moist.  Cardiovascular: Normal rate, regular rhythm and intact distal pulses.   Pulmonary/Chest: Effort normal and breath sounds normal. No respiratory distress. She has no wheezes. She has no rales.  Abdominal: Soft. Normal appearance. She exhibits no distension and no mass. There is no hepatosplenomegaly. There is  tenderness in the suprapubic area. There is no rigidity, no rebound, no guarding, no CVA tenderness and no tenderness at McBurney's point.  Mild ttp of the suprapubic region.  Remaining abdomen is soft, non-tender without guarding or rebound tenderness. No CVA tenderness  Musculoskeletal: Normal range of motion. She exhibits no edema.  ttp of the right lumbar paraspinal muscles.  No midline tenderness  Neurological: She is alert and oriented to person, place, and time. No sensory deficit. Coordination normal.  Skin: Skin is warm and dry. No rash noted.  Nursing note and vitals reviewed.    ED Treatments / Results  Labs (all labs ordered are listed, but only abnormal results are displayed) Labs Reviewed  URINALYSIS, ROUTINE W REFLEX MICROSCOPIC - Abnormal; Notable for the following:       Result Value   Color, Urine STRAW (*)    APPearance HAZY (*)    Hgb urine dipstick MODERATE (*)    Protein, ur 30 (*)    Leukocytes, UA SMALL (*)    Bacteria, UA RARE (*)    Squamous Epithelial / LPF 0-5 (*)    Non Squamous Epithelial 0-5 (*)    All other components within normal limits  URINE CULTURE  PREGNANCY, URINE    EKG  EKG Interpretation None       Radiology No results found.  Procedures Procedures (including critical care time)  Medications Ordered in ED Medications  cephALEXin (KEFLEX) capsule 500 mg (500 mg Oral Given 11/06/16 2325)  phenazopyridine (PYRIDIUM) tablet 200 mg (200 mg Oral Given 11/06/16 2325)     Initial Impression / Assessment and Plan / ED Course  I have reviewed the triage vital signs and the nursing notes.  Pertinent labs & imaging results that were available during my care of the patient were reviewed by me and considered in my medical decision making (see chart for details).     Pt is well appearing.  Non-toxic.  Afebrile.  No concerning sx's for pyelonephritis.  Urine culture pending.  Rx for keflex and pyridium.  Return precautions discussed.    Final Clinical Impressions(s) / ED Diagnoses   Final diagnoses:  Acute cystitis with hematuria    New Prescriptions Discharge Medication List as of 11/06/2016 11:38 PM    START taking these medications   Details  cephALEXin (KEFLEX) 500 MG capsule Take 1 capsule (500 mg total) by mouth 4 (four) times daily. For 7 days, Starting Fri 11/06/2016, Print    phenazopyridine (PYRIDIUM) 200 MG tablet Take 1 tablet (200 mg total) by mouth 3 (three) times daily., Starting Fri 11/06/2016, Print         [place] Mullett, PA-C 11/07/16 0005    Vanetta MuldersZackowski, Scott, MD 11/07/16 [phone]

## 2016-11-10 LAB — URINE CULTURE

## 2016-11-11 ENCOUNTER — Telehealth: Payer: Self-pay | Admitting: Emergency Medicine

## 2016-11-11 NOTE — Telephone Encounter (Signed)
Post ED Visit - Positive Culture Follow-up  Culture report reviewed by antimicrobial stewardship pharmacist:  []  Colleen Hamilton, Pharm.D. []  Celedonio MiyamotoJeremy Hamilton, Pharm.D., BCPS AQ-ID []  Garvin FilaMike Maccia, Pharm.D., BCPS []  Georgina PillionElizabeth Martin, Pharm.D., BCPS []  RioMinh Hamilton, [address] BoulevardPharm.D., BCPS, AAHIVP []  Estella HuskMichelle Turner, Pharm.D., BCPS, AAHIVP []  Lysle Pearlachel Rumbarger, PharmD, BCPS []  Casilda Carlsaylor Stone, PharmD, BCPS []  Pollyann SamplesAndy Johnston, PharmD, BCPS Donnella Biyler Colvard PharmD  Positive urine culture Treated with cephalexin, organism sensitive to the same and no further patient follow-up is required at this time.  Berle MullMiller, Siyah Mault 11/11/2016, 2:01 PM

## 2016-12-26 IMAGING — US US TRANSVAGINAL NON-OB
1 series · 14 of 25 positions shown · non-contrast
Comparison: 01/15/2016 pelvic ultrasound.

CLINICAL DATA: 25 y/o F; status post abortion with concern for
retained products of conception.

EXAM:
TRANSABDOMINAL AND TRANSVAGINAL ULTRASOUND OF PELVIS
TECHNIQUE: Both transabdominal and transvaginal ultrasound examinations of the
pelvis were performed. Transabdominal technique was performed for
global imaging of the pelvis including uterus, ovaries, adnexal
regions, and pelvic cul-de-sac. It was necessary to proceed with
endovaginal exam following the transabdominal exam to visualize the
uterus and endometrium.

[Series 1: us transvaginal non-ob · 0.19mm/px · 14 of 134 slices shown]
[im 1/134]
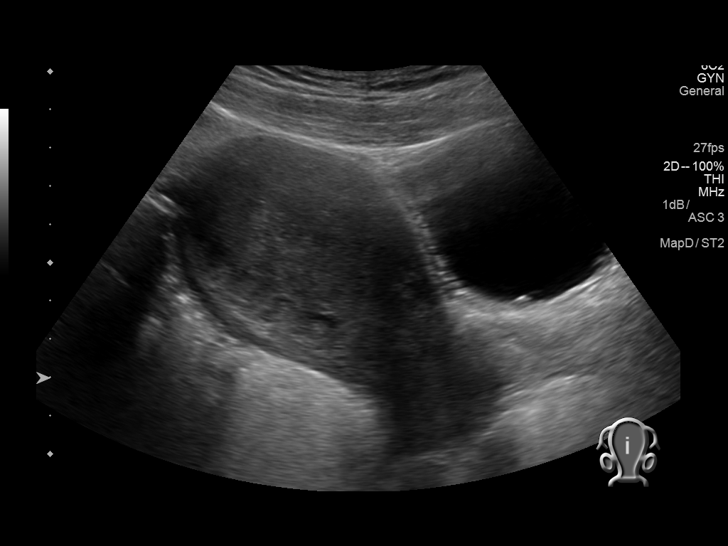
[im 12/134]
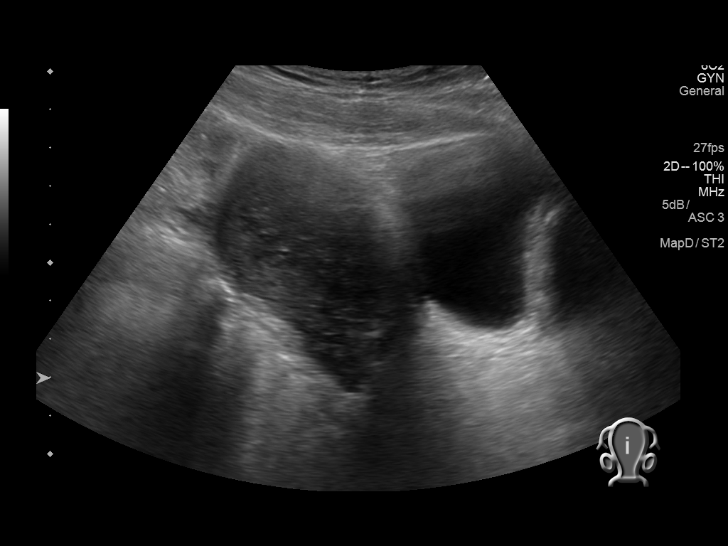
[im 23/134]
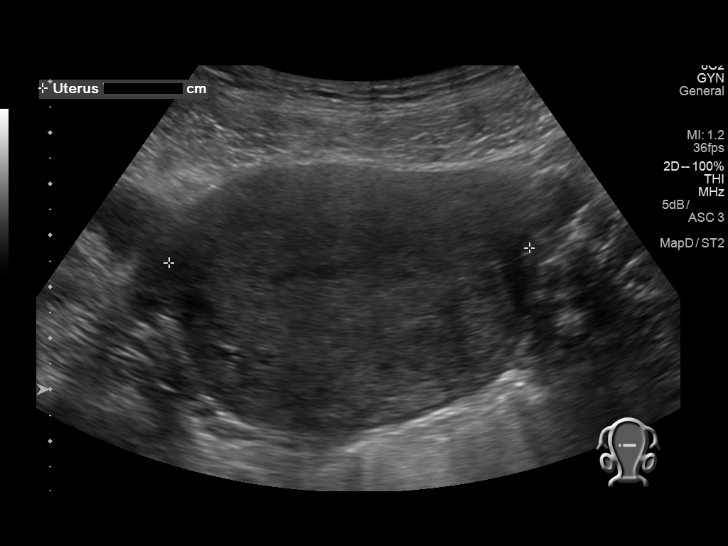
[im 34/134]
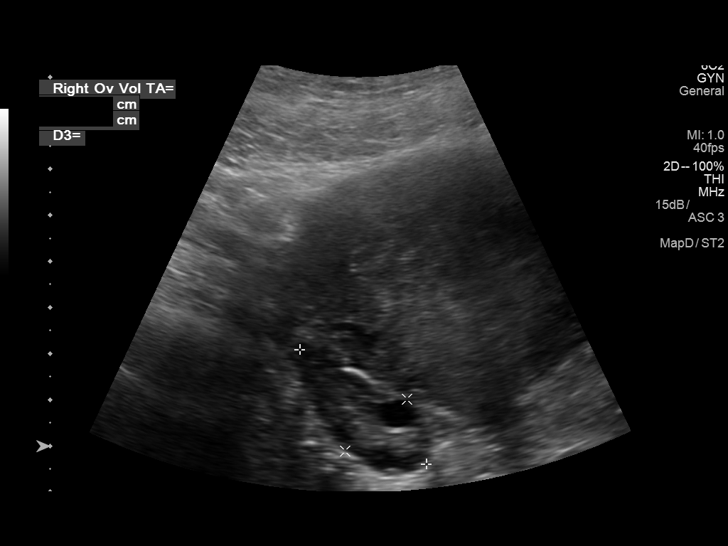
[im 45/134]
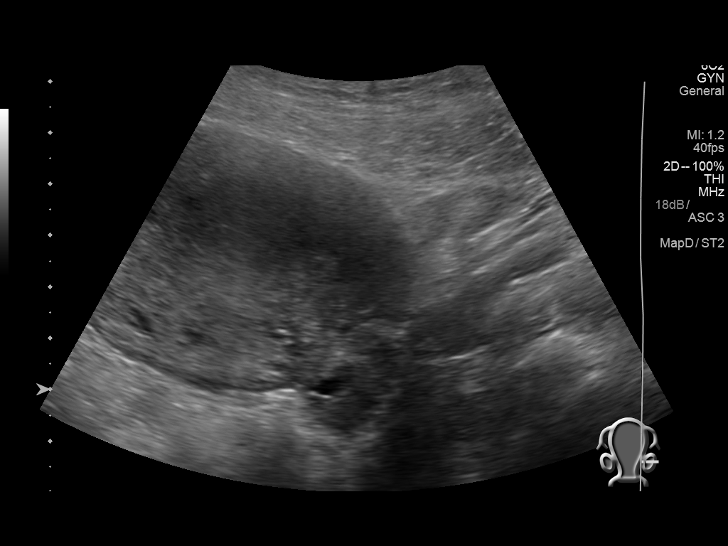
[im 50/134]
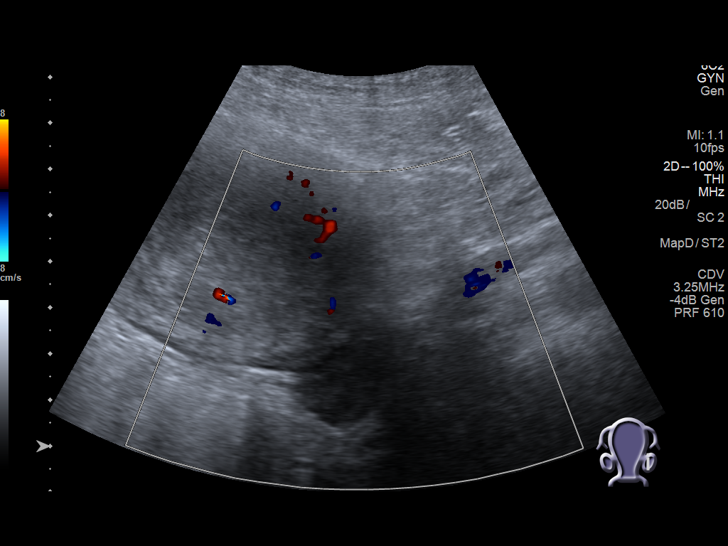
[im 61/134]
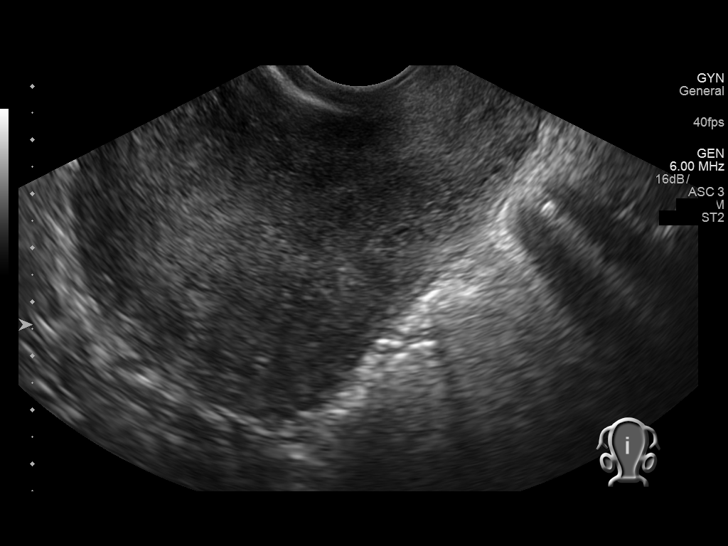
[im 73/134]
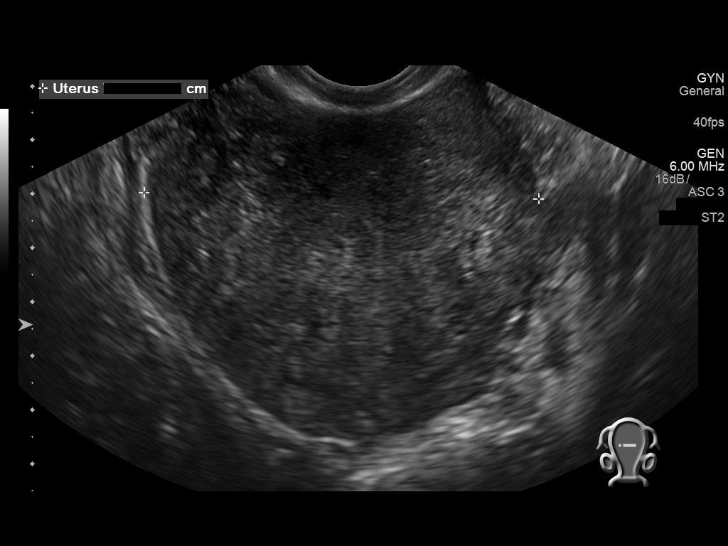
[im 84/134]
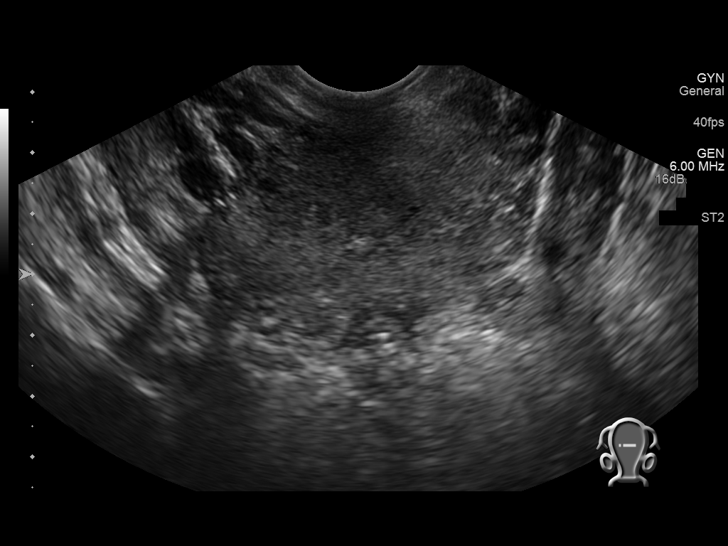
[im 89/134]
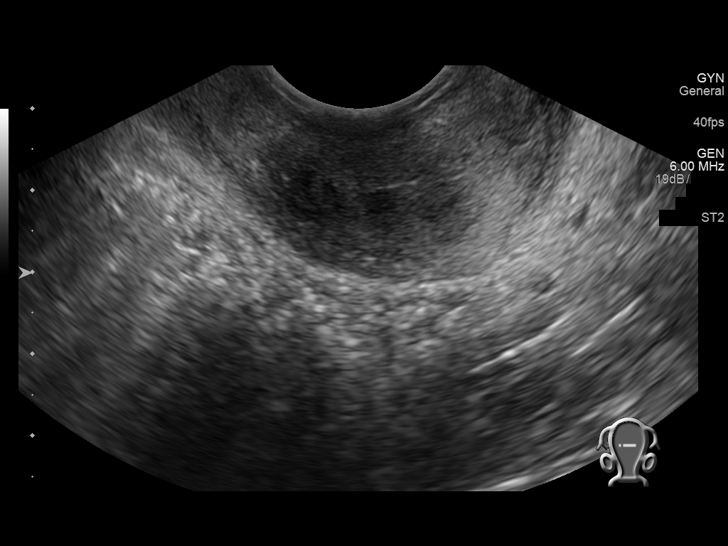
[im 100/134]
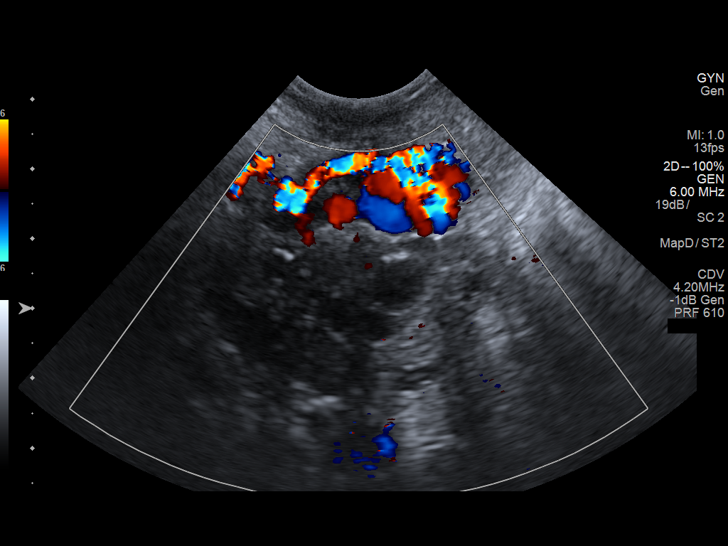
[im 111/134]
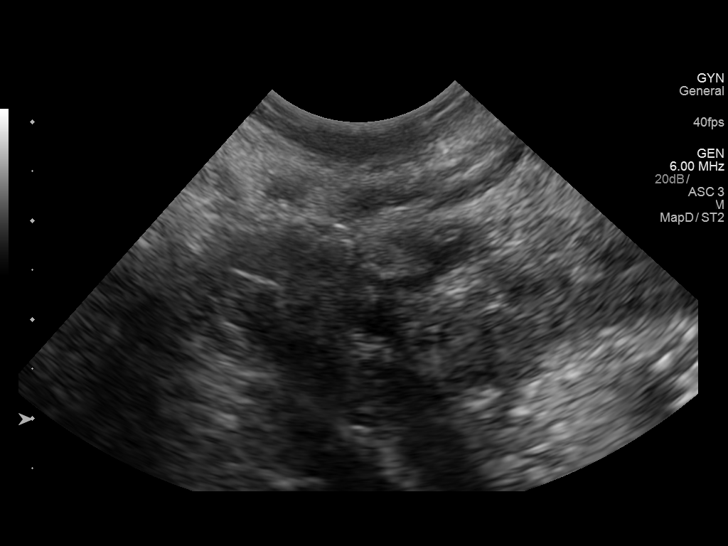
[im 122/134]
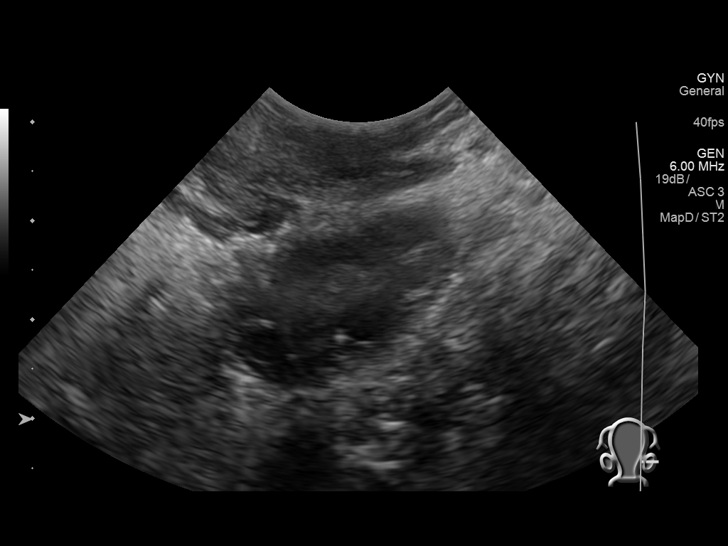
[im 134/134]
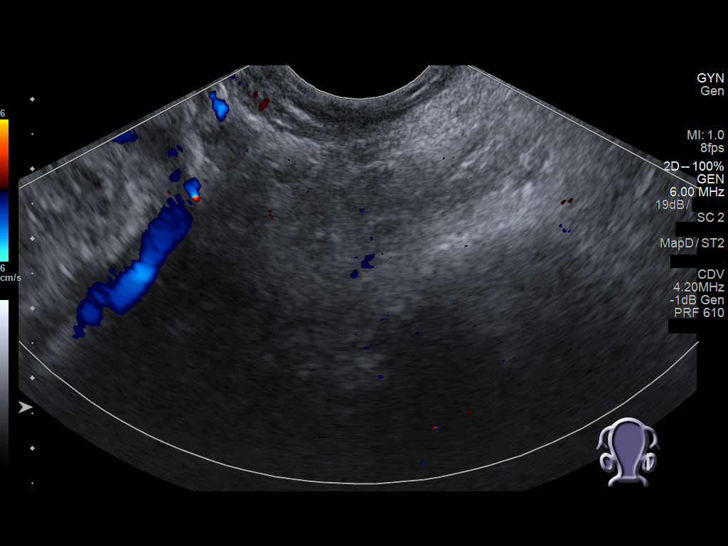

[14 of 25 positions shown; findings below may reference images not displayed]

FINDINGS: Uterus

Measurements: 9.9 x 7.6 x 7.0 cm. No fibroids or other mass
visualized.

Endometrium

Thickness: 13 mm. No focal abnormality visualized. No hypervascular
mass.

Right ovary

Measurements: 3.7 x 2.0 x 3.7 cm. Normal appearance/no adnexal mass.

Left ovary

Measurements: 3.1 x 1.4 x 2.7 cm. Normal appearance/no adnexal mass.

Other findings

No abnormal free fluid.
IMPRESSION: No acute process or intrauterine pregnancy identified. No evidence
for retained products of conception.

By: Drey Jim M.D.

## 2017-01-26 ENCOUNTER — Encounter (HOSPITAL_COMMUNITY): Payer: Self-pay | Admitting: Emergency Medicine

## 2017-01-26 ENCOUNTER — Emergency Department (HOSPITAL_COMMUNITY)
Admission: EM | Admit: 2017-01-26 | Discharge: 2017-01-26 | Disposition: A | Discharge status: Home / Self Care | Payer: Self-pay | Attending: Emergency Medicine | Admitting: Emergency Medicine

## 2017-01-26 DIAGNOSIS — G8929 Other chronic pain: Secondary | ICD-10-CM | POA: Insufficient documentation

## 2017-01-26 DIAGNOSIS — Y999 Unspecified external cause status: Secondary | ICD-10-CM | POA: Insufficient documentation

## 2017-01-26 DIAGNOSIS — Y9389 Activity, other specified: Secondary | ICD-10-CM | POA: Insufficient documentation

## 2017-01-26 DIAGNOSIS — S39012A Strain of muscle, fascia and tendon of lower back, initial encounter: Secondary | ICD-10-CM | POA: Insufficient documentation

## 2017-01-26 DIAGNOSIS — X58XXXA Exposure to other specified factors, initial encounter: Secondary | ICD-10-CM | POA: Insufficient documentation

## 2017-01-26 DIAGNOSIS — M67479 Ganglion, unspecified ankle and foot: Secondary | ICD-10-CM

## 2017-01-26 DIAGNOSIS — F17210 Nicotine dependence, cigarettes, uncomplicated: Secondary | ICD-10-CM | POA: Insufficient documentation

## 2017-01-26 DIAGNOSIS — Y929 Unspecified place or not applicable: Secondary | ICD-10-CM | POA: Insufficient documentation

## 2017-01-26 DIAGNOSIS — M67471 Ganglion, right ankle and foot: Secondary | ICD-10-CM | POA: Insufficient documentation

## 2017-01-26 LAB — URINALYSIS, ROUTINE W REFLEX MICROSCOPIC
BILIRUBIN URINE: NEGATIVE
GLUCOSE, UA: NEGATIVE mg/dL
HGB URINE DIPSTICK: NEGATIVE
Ketones, ur: NEGATIVE mg/dL
Leukocytes, UA: NEGATIVE
Nitrite: NEGATIVE
PROTEIN: NEGATIVE mg/dL
SPECIFIC GRAVITY, URINE: 1.013 (ref 1.005–1.030)
pH: 5 (ref 5.0–8.0)

## 2017-01-26 LAB — PREGNANCY, URINE: PREG TEST UR: NEGATIVE

## 2017-01-26 MED ORDER — CYCLOBENZAPRINE HCL 5 MG PO TABS: 5.0000 mg | ORAL_TABLET | Freq: Three times a day (TID) | ORAL | 0 refills | Status: DC | PRN

## 2017-01-26 MED ORDER — IBUPROFEN 600 MG PO TABS: 600.0000 mg | ORAL_TABLET | Freq: Four times a day (QID) | ORAL | 0 refills | Status: DC | PRN

## 2017-01-26 NOTE — ED Provider Notes (Signed)
[facility] Provider Note   CSN: [id number] Arrival date & time: 01/26/17  1213     History   Chief Complaint Chief Complaint  Patient presents with  . Back Pain    HPI Colleen Hamilton is a 26 y.o. female with past medical history as outlined below presenting with acute on chronic mid right back pain which she describes as aching with radiation into her upper back with movement, palpation and sometimes deep inspiration.  She has had this symptom intermittently for the past several months.  She has been seen in the past for this, with no clear diagnosis found.  She denies shortness of breath, chest pain, cough wheezing, fevers or chills.  She does endorse feeling some nasal congestion with sinus pressure and believes she may be developing a cold.  She has had no medications prior to arrival for this complaint.  Other pertinent negatives include dysuria, hematuria urinary frequency or urgency, diarrhea, abdominal pain, pelvic pain or vaginal discharge.  Incidentally, patient points out a tender (to palpation) nodule on her right dorsal foot which has been present also for several months and seems to be more tender with time.  She denies injury, significant changes in footwear or shoes that irritate this location.  There is no pain with ankle or toe flexion or weightbearing.  The history is provided by the patient.    Past Medical History:  Diagnosis Date  . Cervical high risk human papillomavirus (HPV) DNA test positive 07/21/2016  . Medical history non-contributory     Patient Active Problem List   Diagnosis Date Noted  . Cervical high risk human papillomavirus (HPV) DNA test positive 07/21/2016  . Encounter for routine gynecological examination with Papanicolaou smear of cervix 07/15/2016  . Screening for STD (sexually transmitted disease) 07/15/2016  . Encounter for initial prescription of contraceptive pills 07/15/2016  . Contraceptive education 07/15/2016      Past Surgical History:  Procedure Laterality Date  . INDUCED ABORTION      OB History    Gravida Para Term Preterm AB Living   3 1 0 0 1 1   SAB TAB Ectopic Multiple Live Births     1 0 0 1       Home Medications    Prior to Admission medications   Medication Sig Start Date End Date Taking? Authorizing Provider  cephALEXin (KEFLEX) 500 MG capsule Take 1 capsule (500 mg total) by mouth 4 (four) times daily. For 7 days 11/06/16   Triplett, Tammy, PA-C  cyclobenzaprine (FLEXERIL) 5 MG tablet Take 1 tablet (5 mg total) by mouth 3 (three) times daily as needed for muscle spasms. 01/26/17   Burgess AmorIdol, Kimya Mccahill, PA-C  ibuprofen (ADVIL,MOTRIN) 600 MG tablet Take 1 tablet (600 mg total) by mouth every 6 (six) hours as needed. 01/26/17   Burgess AmorIdol, Kash Mothershead, PA-C  phenazopyridine (PYRIDIUM) 200 MG tablet Take 1 tablet (200 mg total) by mouth 3 (three) times daily. 11/06/16   Pauline Ausriplett, Tammy, PA-C    Family History Family History  Problem Relation Age of Onset  . Hypertension Mother     Social History Social History  Substance Use Topics  . Smoking status: Current Every Day Smoker    Packs/day: 0.01    Types: Cigars  . Smokeless tobacco: Never Used  . Alcohol use No     Allergies   Patient has no known allergies.   Review of Systems Review of Systems  Constitutional: Negative for fever.  HENT: Positive for congestion.  Respiratory: Negative for shortness of breath.   Cardiovascular: Negative for chest pain and leg swelling.  Gastrointestinal: Negative for abdominal distention, abdominal pain and constipation.  Genitourinary: Negative for difficulty urinating, dysuria, flank pain, frequency and urgency.  Musculoskeletal: Positive for back pain. Negative for gait problem and joint swelling.  Skin: Negative for rash.  Neurological: Negative for weakness and numbness.     Physical Exam Updated Vital Signs BP (!) 121/49 (BP Location: Left Arm)   Pulse 88   Temp 98.1 F (36.7 C)  (Oral)   Resp 16   Ht 5' 2.5" (1.588 m)   Wt 94.3 kg (208 lb)   LMP 01/26/2017   SpO2 100%   BMI 37.44 kg/m   Physical Exam  Constitutional: She appears well-developed and well-nourished.  HENT:  Head: Normocephalic.  Eyes: Conjunctivae are normal.  Neck: Normal range of motion. Neck supple.  Cardiovascular: Normal rate and intact distal pulses.   Pedal pulses normal.  Pulmonary/Chest: Effort normal.  Abdominal: Soft. Bowel sounds are normal. She exhibits no distension and no mass. There is no CVA tenderness.  Musculoskeletal: Normal range of motion. She exhibits tenderness. She exhibits no edema.       Lumbar back: She exhibits tenderness. She exhibits no swelling, no edema and no spasm.       Right foot: There is tenderness and swelling.       Feet:  Right paralumbar tenderness to palpation.  There is no midline bony tenderness, deformity or step-offs.  Neurological: She is alert. She has normal strength. She displays no atrophy and no tremor. No sensory deficit. Gait normal.  Reflex Scores:      Patellar reflexes are 2+ on the right side and 2+ on the left side.      Achilles reflexes are 2+ on the right side and 2+ on the left side. No strength deficit noted in hip and knee flexor and extensor muscle groups.  Ankle flexion and extension intact.  Skin: Skin is warm and dry.  Psychiatric: She has a normal mood and affect.  Nursing note and vitals reviewed.    ED Treatments / Results  Labs (all labs ordered are listed, but only abnormal results are displayed) Labs Reviewed  URINALYSIS, ROUTINE W REFLEX MICROSCOPIC - Abnormal; Notable for the following:       Result Value   APPearance HAZY (*)    All other components within normal limits  PREGNANCY, URINE    EKG  EKG Interpretation None       Radiology  No results found.   Procedures Procedures (including critical care time)  Medications Ordered in ED Medications - No data to display   Initial  Impression / Assessment and Plan / ED Course  I have reviewed the triage vital signs and the nursing notes.  Pertinent labs & imaging results that were available during my care of the patient were reviewed by me and considered in my medical decision making (see chart for details).     Exam consistent with right-sided lumbar soft tissue strain.  Suspected ganglion cyst of right foot.  Advised heat therapy for both complaints.  She was prescribed Flexeril and ibuprofen.  Referrals given for obtaining a PCP and for podiatry referral for further evaluation of her foot complaint.  Final Clinical Impressions(s) / ED Diagnoses   Final diagnoses:  Strain of lumbar region, initial encounter  Ganglion cyst of foot    New Prescriptions Discharge Medication List as of 01/26/2017  2:25 PM  START taking these medications   Details  cyclobenzaprine (FLEXERIL) 5 MG tablet Take 1 tablet (5 mg total) by mouth 3 (three) times daily as needed for muscle spasms., Starting Tue 01/26/2017, Print         Burgess Amor, PA-C 01/28/17 1205    Samuel Jester, DO 01/29/17 [phone]

## 2017-01-26 NOTE — ED Triage Notes (Addendum)
Patient c/o constant mid left back pain "for a while" that hurts only with sitting forward and is relieved with laying back. Per patient has had this in past and seen but never given a clear diagnosis. Patient states that she now has a sharp pain in right mid back with deep breath and occasional upper back pain. Denies any urinary symptoms. Patient also reports possible URI with sinus pressure, no cough. Denies any fevers but states boyfriend stated she was "shaking in her sleep a little last night."

## 2017-01-26 NOTE — Discharge Instructions (Signed)
Use the medicines prescribed as discussed.  A heating pad applied to your back (and foot) for 20 minutes several times daily can also be helpful.

## 2018-01-31 ENCOUNTER — Emergency Department (HOSPITAL_COMMUNITY)
Admission: EM | Admit: 2018-01-31 | Discharge: 2018-01-31 | Disposition: A | Discharge status: Home / Self Care | Payer: Self-pay | Attending: Emergency Medicine | Admitting: Emergency Medicine

## 2018-01-31 ENCOUNTER — Encounter (HOSPITAL_COMMUNITY): Payer: Self-pay | Admitting: *Deleted

## 2018-01-31 ENCOUNTER — Other Ambulatory Visit: Payer: Self-pay

## 2018-01-31 DIAGNOSIS — B9689 Other specified bacterial agents as the cause of diseases classified elsewhere: Secondary | ICD-10-CM

## 2018-01-31 DIAGNOSIS — N760 Acute vaginitis: Secondary | ICD-10-CM | POA: Insufficient documentation

## 2018-01-31 DIAGNOSIS — F17210 Nicotine dependence, cigarettes, uncomplicated: Secondary | ICD-10-CM | POA: Insufficient documentation

## 2018-01-31 DIAGNOSIS — R102 Pelvic and perineal pain: Secondary | ICD-10-CM | POA: Insufficient documentation

## 2018-01-31 DIAGNOSIS — Z79899 Other long term (current) drug therapy: Secondary | ICD-10-CM | POA: Insufficient documentation

## 2018-01-31 LAB — URINALYSIS, ROUTINE W REFLEX MICROSCOPIC
Bilirubin Urine: NEGATIVE
GLUCOSE, UA: NEGATIVE mg/dL
Hgb urine dipstick: NEGATIVE
Ketones, ur: NEGATIVE mg/dL
LEUKOCYTES UA: NEGATIVE
Nitrite: NEGATIVE
PROTEIN: NEGATIVE mg/dL
Specific Gravity, Urine: 1.024 (ref 1.005–1.030)
pH: 6 (ref 5.0–8.0)

## 2018-01-31 LAB — WET PREP, GENITAL
Sperm: NONE SEEN
Trich, Wet Prep: NONE SEEN
Yeast Wet Prep HPF POC: NONE SEEN

## 2018-01-31 LAB — PREGNANCY, URINE: Preg Test, Ur: NEGATIVE

## 2018-01-31 MED ORDER — METRONIDAZOLE 500 MG PO TABS: 500.0000 mg | ORAL_TABLET | Freq: Two times a day (BID) | ORAL | 0 refills | Status: DC

## 2018-01-31 NOTE — ED Provider Notes (Signed)
[facility] EMERGENCY DEPARTMENT Provider Note   CSN: [id number] Arrival date & time: 01/31/18  1426     History   Chief Complaint Chief Complaint  Patient presents with  . Groin Swelling    HPI Colleen Hamilton is a 27 y.o. female with a hx of tobacco abuse who presents to the emergency department with complaints of vaginal concerns for the past 2 weeks.  Patient states that her vagina feels swollen in the canal with associated throbbing sensation which occurs intermittently as well as malodorous discharge.  Symptoms are without specific alleviating or aggravating factors.  She has not tried intervention at home.  She states she feels like there is "more air or space up there."  She denies fever, chills, nausea, vomiting, abdominal pain, or dysuria.  She states that she is sexually active in a monogamous relationship and does not use protection, she is not concerned for STDs but would like a pregnancy test. HPI  Past Medical History:  Diagnosis Date  . Cervical high risk human papillomavirus (HPV) DNA test positive 07/21/2016  . Medical history non-contributory     Patient Active Problem List   Diagnosis Date Noted  . Cervical high risk human papillomavirus (HPV) DNA test positive 07/21/2016  . Encounter for routine gynecological examination with Papanicolaou smear of cervix 07/15/2016  . Screening for STD (sexually transmitted disease) 07/15/2016  . Encounter for initial prescription of contraceptive pills 07/15/2016  . Contraceptive education 07/15/2016    Past Surgical History:  Procedure Laterality Date  . INDUCED ABORTION       OB History    Gravida  3   Para  1   Term  0   Preterm  0   AB  1   Living  1     SAB      TAB  1   Ectopic  0   Multiple  0   Live Births  1            Home Medications    Prior to Admission medications   Medication Sig Start Date End Date Taking? Authorizing Provider  cephALEXin (KEFLEX) 500 MG capsule Take 1  capsule (500 mg total) by mouth 4 (four) times daily. For 7 days 11/06/16   Triplett, Tammy, PA-C  cyclobenzaprine (FLEXERIL) 5 MG tablet Take 1 tablet (5 mg total) by mouth 3 (three) times daily as needed for muscle spasms. 01/26/17   Burgess Amor, PA-C  ibuprofen (ADVIL,MOTRIN) 600 MG tablet Take 1 tablet (600 mg total) by mouth every 6 (six) hours as needed. 01/26/17   Burgess Amor, PA-C  phenazopyridine (PYRIDIUM) 200 MG tablet Take 1 tablet (200 mg total) by mouth 3 (three) times daily. 11/06/16   Pauline Aus, PA-C    Family History Family History  Problem Relation Age of Onset  . Hypertension Mother     Social History Social History   Tobacco Use  . Smoking status: Current Every Day Smoker    Packs/day: 0.01    Types: Cigars  . Smokeless tobacco: Never Used  Substance Use Topics  . Alcohol use: Yes    Alcohol/week: 1.0 standard drinks    Types: 1 Shots of liquor per week  . Drug use: Yes    Types: Marijuana     Allergies   Patient has no known allergies.   Review of Systems Review of Systems  Constitutional: Negative for chills and fever.  Gastrointestinal: Negative for blood in stool, nausea and vomiting.  Genitourinary: Positive for  vaginal discharge and vaginal pain. Negative for dysuria, frequency, urgency and vaginal bleeding.   Physical Exam Updated Vital Signs BP 109/74 (BP Location: Right Arm)   Pulse 79   Temp 98.6 F (37 C) (Oral)   Resp 14   Ht 5\' 2"  (1.575 m)   Wt 104.3 kg   LMP 01/02/2018 (Exact Date)   SpO2 100%   BMI 42.07 kg/m   Physical Exam  Constitutional: She appears well-developed and well-nourished. No distress.  HENT:  Head: Normocephalic and atraumatic.  Eyes: Conjunctivae are normal. Right eye exhibits no discharge. Left eye exhibits no discharge.  Abdominal: Soft. She exhibits no distension. There is no tenderness. There is no rebound and no guarding.  Genitourinary: Pelvic exam was performed with patient supine. There is no  rash, tenderness or lesion on the right labia. There is no rash, tenderness or lesion on the left labia. Cervix exhibits discharge (white thin, mild amount). Cervix exhibits no motion tenderness and no friability. Right adnexum displays no mass, no tenderness and no fullness. Left adnexum displays no mass, no tenderness and no fullness. No erythema, tenderness or bleeding in the vagina. No foreign body in the vagina.  Genitourinary Comments: RN Marcelino Duster present as chaperone. No obvious swelling, erythema, warmth, or rashes noted throughout pelvic exam.   Lymphadenopathy: No inguinal adenopathy noted on the right or left side.  Neurological: She is alert.  Clear speech.   Skin: Skin is warm and dry.  Psychiatric: She has a normal mood and affect. Her behavior is normal. Thought content normal.  Nursing note and vitals reviewed.    ED Treatments / Results  Labs (all labs ordered are listed, but only abnormal results are displayed) Labs Reviewed  URINALYSIS, ROUTINE W REFLEX MICROSCOPIC - Abnormal; Notable for the following components:      Result Value   APPearance HAZY (*)    All other components within normal limits  WET PREP, GENITAL  PREGNANCY, URINE  GC/CHLAMYDIA PROBE AMP ([place]) NOT AT [facility]    EKG None  Radiology No results found.  Procedures Procedures (including critical care time)  Medications Ordered in ED Medications - No data to display   Initial Impression / Assessment and Plan / ED Course  I have reviewed the triage vital signs and the nursing notes.  Pertinent labs & imaging results that were available during my care of the patient were reviewed by me and considered in my medical decision making (see chart for details).   Patient presents to the emergency department with complaints of abnormal vaginal sensation described as swelling/throbbing/air as well as malodorous vaginal discharge.  Patient nontoxic appearing, no apparent distress, vitals WNL.  There  is no abdominal tenderness or pelvic tenderness to raise concern for PID.  Her pregnancy test is negative therefore do not suspect ectopic pregnancy.  Gonorrhea and Chlamydia cultures pending, patient aware that they are pending and she will need to seek treatment and inform all sexual partners of positive.  Urinalysis without evidence of infection.  Wet prep shows findings consistent with bacterial vaginosis, will treat with Flagyl, discussed patient is not to consume EtOH when taking flagyl. I discussed results, treatment plan, need for PCP or obgyn follow-up, and return precautions with the patient. Provided opportunity for questions, patient confirmed understanding and is in agreement with plan.   Final Clinical Impressions(s) / ED Diagnoses   Final diagnoses:  Bacterial vaginosis    ED Discharge Orders         Ordered  metroNIDAZOLE (FLAGYL) 500 MG tablet  2 times daily     01/31/18 1622           Deklan Minar, [employer] R, PA-C 01/31/18 1623    Samuel Jester, DO 02/01/18 2030

## 2018-01-31 NOTE — ED Triage Notes (Signed)
Patient reports vaginal swelling, air collection with tenderness in vaginal area for about 2 weeks.  Reports vaginal discharge with odor.

## 2018-01-31 NOTE — Discharge Instructions (Addendum)
You are seen in the emergency department due to vaginal concerns.  Your pregnancy test was negative.  Your urine did not show signs of a UTI.  We tested you for gonorrhea and chlamydia, we will call you if these results are positive, if positive you will need to seek treatment and inform all sexual partners.  Your wet prep showed findings of bacterial vaginosis which we are treating with Flagyl-an antibiotic.  You may not drink alcohol when taking this medication as it has severe side effects.  We have prescribed you new medication(s) today. Discuss the medications prescribed today with your pharmacist as they can have adverse effects and interactions with your other medicines including over the counter and prescribed medications. Seek medical evaluation if you start to experience new or abnormal symptoms after taking one of these medicines, seek care immediately if you start to experience difficulty breathing, feeling of your throat closing, facial swelling, or rash as these could be indications of a more serious allergic reaction  Please follow-up with your primary care provider or with the OB/GYN clinic provided your discharge instructions within the next 1 week.  Return to ER for new or worsening symptoms or any other concerns.

## 2018-02-01 LAB — GC/CHLAMYDIA PROBE AMP (~~LOC~~) NOT AT ARMC
Chlamydia: NEGATIVE
Neisseria Gonorrhea: NEGATIVE

## 2018-04-23 ENCOUNTER — Emergency Department (HOSPITAL_COMMUNITY): Payer: BLUE CROSS/BLUE SHIELD

## 2018-04-23 ENCOUNTER — Encounter (HOSPITAL_COMMUNITY): Payer: Self-pay

## 2018-04-23 ENCOUNTER — Other Ambulatory Visit: Payer: Self-pay

## 2018-04-23 ENCOUNTER — Emergency Department (HOSPITAL_COMMUNITY)
Admission: EM | Admit: 2018-04-23 | Discharge: 2018-04-23 | Disposition: A | Discharge status: Home / Self Care | Payer: BLUE CROSS/BLUE SHIELD | Attending: Emergency Medicine | Admitting: Emergency Medicine

## 2018-04-23 DIAGNOSIS — F17210 Nicotine dependence, cigarettes, uncomplicated: Secondary | ICD-10-CM | POA: Diagnosis not present

## 2018-04-23 DIAGNOSIS — Y9289 Other specified places as the place of occurrence of the external cause: Secondary | ICD-10-CM | POA: Insufficient documentation

## 2018-04-23 DIAGNOSIS — W228XXA Striking against or struck by other objects, initial encounter: Secondary | ICD-10-CM | POA: Diagnosis not present

## 2018-04-23 DIAGNOSIS — S62306A Unspecified fracture of fifth metacarpal bone, right hand, initial encounter for closed fracture: Secondary | ICD-10-CM | POA: Diagnosis not present

## 2018-04-23 DIAGNOSIS — S6991XA Unspecified injury of right wrist, hand and finger(s), initial encounter: Secondary | ICD-10-CM | POA: Diagnosis present

## 2018-04-23 DIAGNOSIS — Y9389 Activity, other specified: Secondary | ICD-10-CM | POA: Diagnosis not present

## 2018-04-23 DIAGNOSIS — Y998 Other external cause status: Secondary | ICD-10-CM | POA: Insufficient documentation

## 2018-04-23 NOTE — Discharge Instructions (Signed)
Apply ice for thirty minutes, four times a day.  Take ibuprofen or naproxen as needed for pain. If necessary, add acetaminophen for additional pain relief.
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
## 2018-04-23 NOTE — ED Triage Notes (Signed)
Pt states she punched a wall approx 20 minutes ago

## 2018-05-03 ENCOUNTER — Telehealth: Payer: Self-pay | Admitting: Orthopedic Surgery

## 2018-05-03 NOTE — Telephone Encounter (Signed)
Colleen Hamilton called here this morning stating that our office was to have called her last week to give her an appointment.  Upon reviewing her notes from the ED, she was referred to Dr. Mack Hook who specializes in hands.  I told her this and gave her the phone number to that office.  She said she would call them today

## 2018-05-12 ENCOUNTER — Ambulatory Visit: Payer: Self-pay | Admitting: Obstetrics and Gynecology

## 2019-04-17 ENCOUNTER — Ambulatory Visit: Payer: Self-pay | Attending: Internal Medicine

## 2019-08-15 ENCOUNTER — Other Ambulatory Visit: Payer: Self-pay

## 2019-08-15 DIAGNOSIS — F17290 Nicotine dependence, other tobacco product, uncomplicated: Secondary | ICD-10-CM | POA: Insufficient documentation

## 2019-08-15 DIAGNOSIS — F1210 Cannabis abuse, uncomplicated: Secondary | ICD-10-CM | POA: Insufficient documentation

## 2019-08-15 DIAGNOSIS — K644 Residual hemorrhoidal skin tags: Secondary | ICD-10-CM | POA: Insufficient documentation

## 2019-08-15 DIAGNOSIS — R1030 Lower abdominal pain, unspecified: Secondary | ICD-10-CM | POA: Insufficient documentation

## 2019-08-16 ENCOUNTER — Emergency Department (HOSPITAL_COMMUNITY)
Admission: EM | Admit: 2019-08-16 | Discharge: 2019-08-16 | Disposition: A | Discharge status: Home / Self Care | Payer: Self-pay | Attending: Emergency Medicine | Admitting: Emergency Medicine

## 2019-08-16 ENCOUNTER — Encounter (HOSPITAL_COMMUNITY): Payer: Self-pay | Admitting: Emergency Medicine

## 2019-08-16 ENCOUNTER — Other Ambulatory Visit: Payer: Self-pay

## 2019-08-16 DIAGNOSIS — K644 Residual hemorrhoidal skin tags: Secondary | ICD-10-CM

## 2019-08-16 DIAGNOSIS — R1030 Lower abdominal pain, unspecified: Secondary | ICD-10-CM

## 2019-08-16 LAB — URINALYSIS, ROUTINE W REFLEX MICROSCOPIC
Bilirubin Urine: NEGATIVE
Glucose, UA: NEGATIVE mg/dL
Hgb urine dipstick: NEGATIVE
Ketones, ur: NEGATIVE mg/dL
Leukocytes,Ua: NEGATIVE
Nitrite: NEGATIVE
Protein, ur: NEGATIVE mg/dL
Specific Gravity, Urine: 1.016 (ref 1.005–1.030)
pH: 7 (ref 5.0–8.0)

## 2019-08-16 LAB — PREGNANCY, URINE: Preg Test, Ur: NEGATIVE

## 2019-08-16 NOTE — ED Triage Notes (Signed)
Patient states that she has been abdominal pain from constipation. Patient states that she was able to pass a little bit of stool earlier today but not a a lot. Patient states a feeling of gas in her stomach and complains of hemorrhoids. Patient states that she has had this feeling since 4 pm yesterday.

## 2019-08-16 NOTE — Discharge Instructions (Signed)

## 2019-08-16 NOTE — ED Provider Notes (Signed)
[facility] EMERGENCY DEPARTMENT Provider Note   CSN: [id number] Arrival date & time: 08/15/19  2347     History Chief Complaint  Patient presents with  . Constipation    Colleen Hamilton is a 29 y.o. female.  The history is provided by the patient.  Constipation Severity:  Moderate Timing:  Constant Progression:  Improving Chronicity:  New Relieved by:  Diet changes Worsened by:  Nothing Associated symptoms: abdominal pain   Associated symptoms: no diarrhea, no dysuria, no fever and no vomiting   Patient presents with hemorrhoids, constipation and abdominal pain. Patient reports that approximately 4 days ago she started noticing hemorrhoids, with mild pain.  No rectal bleeding, no melena.  No vomiting.  She started to change her diet to help with bowel movements.  Several hours ago she was able to have a large nonbloody bowel movement, but had immediate abdominal pain afterwards.  She reports she has to have another bowel movement and has a lot of gas.  No fevers or vomiting. No previous abdominal surgeries. While she has been in the ER her pain is improving.     Past Medical History:  Diagnosis Date  . Cervical high risk human papillomavirus (HPV) DNA test positive 07/21/2016  . Medical history non-contributory     Patient Active Problem List   Diagnosis Date Noted  . Cervical high risk human papillomavirus (HPV) DNA test positive 07/21/2016  . Encounter for routine gynecological examination with Papanicolaou smear of cervix 07/15/2016  . Screening for STD (sexually transmitted disease) 07/15/2016  . Encounter for initial prescription of contraceptive pills 07/15/2016  . Contraceptive education 07/15/2016    Past Surgical History:  Procedure Laterality Date  . INDUCED ABORTION       OB History    Gravida  3   Para  1   Term  0   Preterm  0   AB  1   Living  1     SAB      TAB  1   Ectopic  0   Multiple  0   Live Births  1            Family History  Problem Relation Age of Onset  . Hypertension Mother     Social History   Tobacco Use  . Smoking status: Current Every Day Smoker    Packs/day: 0.01    Types: Cigars  . Smokeless tobacco: Never Used  Substance Use Topics  . Alcohol use: Yes    Alcohol/week: 1.0 standard drinks    Types: 1 Shots of liquor per week  . Drug use: Yes    Types: Marijuana    Home Medications Prior to Admission medications   Not on File    Allergies    Patient has no known allergies.  Review of Systems   Review of Systems  Constitutional: Negative for fever.  Respiratory: Negative for cough.   Gastrointestinal: Positive for abdominal pain and constipation. Negative for diarrhea and vomiting.  Genitourinary: Negative for dysuria, vaginal bleeding and vaginal discharge.  All other systems reviewed and are negative.   Physical Exam Updated Vital Signs BP 111/76 (BP Location: Right Arm)   Pulse 84   Temp 98.2 F (36.8 C) (Oral)   Resp 18   Ht 1.6 m (5\' 3" )   Wt 112.5 kg   LMP 07/27/2019 (Exact Date)   SpO2 100%   BMI 43.93 kg/m   Physical Exam CONSTITUTIONAL: Well developed/well nourished HEAD: Normocephalic/atraumatic EYES: EOMI ENMT:  Mucous membranes moist NECK: supple no meningeal signs SPINE/BACK:entire spine nontender CV: S1/S2 noted, no murmurs/rubs/gallops noted LUNGS: Lungs are clear to auscultation bilaterally, no apparent distress ABDOMEN: soft, nontender, no rebound or guarding, bowel sounds noted throughout abdomen GU:no cva tenderness Rectal-external nonthrombosed hemorrhoids are noted.  No blood or melena is noted.  No abscess noted.  Female nurse present for exam NEURO: Pt is awake/alert/appropriate, moves all extremitiesx4.  No facial droop.  EXTREMITIES: pulses normal/equal, full ROM SKIN: warm, color normal PSYCH: no abnormalities of mood noted, alert and oriented to situation  ED Results / Procedures / Treatments   Labs (all labs  ordered are listed, but only abnormal results are displayed) Labs Reviewed  URINALYSIS, ROUTINE W REFLEX MICROSCOPIC - Abnormal; Notable for the following components:      Result Value   APPearance HAZY (*)    All other components within normal limits  PREGNANCY, URINE    EKG None  Radiology No results found.  Procedures Procedures  Medications Ordered in ED Medications - No data to display  ED Course  I have reviewed the triage vital signs and the nursing notes.  Pertinent labs results that were available during my care of the patient were reviewed by me and considered in my medical decision making (see chart for details).    MDM Rules/Calculators/A&P                      1:19 AM Patient is very well-appearing.  No focal abdominal tenderness.  She reports she is already improving.  Will monitor in the ER and reassess.  Will check urinalysis. 2:30 AM Pt improved No distress She is watching TV I have low suspicion for acute abdominal/gynecologic emergency at this time We discussed use of stool softeners and treatment of hemorrhoids. We discussed strict return precautions  Patient is appropriate for d/c home.  I doubt acute abdominal emergency at this time.  We discussed strict ER return precautions including abdominal pain that migrates to RLQ, fever >100.43F with repetitive vomiting over next 8-12 hours Final Clinical Impression(s) / ED Diagnoses Final diagnoses:  Lower abdominal pain  External hemorrhoids    Rx / DC Orders ED Discharge Orders    None       Ripley Fraise, MD 08/16/19 [phone]
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
## 2020-03-28 ENCOUNTER — Emergency Department (HOSPITAL_COMMUNITY)
Admission: EM | Admit: 2020-03-28 | Discharge: 2020-03-28 | Disposition: A | Discharge status: Home / Self Care | Payer: Self-pay | Attending: Emergency Medicine | Admitting: Emergency Medicine

## 2020-03-28 ENCOUNTER — Other Ambulatory Visit: Payer: Self-pay

## 2020-03-28 DIAGNOSIS — F17290 Nicotine dependence, other tobacco product, uncomplicated: Secondary | ICD-10-CM | POA: Insufficient documentation

## 2020-03-28 DIAGNOSIS — T2121XA Burn of second degree of chest wall, initial encounter: Secondary | ICD-10-CM | POA: Insufficient documentation

## 2020-03-28 DIAGNOSIS — X102XXA Contact with fats and cooking oils, initial encounter: Secondary | ICD-10-CM | POA: Insufficient documentation

## 2020-03-28 DIAGNOSIS — Y93G3 Activity, cooking and baking: Secondary | ICD-10-CM | POA: Insufficient documentation

## 2020-03-28 MED ORDER — HYDROCODONE-ACETAMINOPHEN 5-325 MG PO TABS: 1.0000 | ORAL_TABLET | ORAL | 0 refills | Status: DC | PRN

## 2020-03-28 MED ORDER — SILVER SULFADIAZINE 1 % EX CREA: TOPICAL_CREAM | Freq: Once | CUTANEOUS | Status: AC

## 2020-03-28 MED ORDER — SILVER SULFADIAZINE 1 % EX CREA: 1.0000 "application " | TOPICAL_CREAM | Freq: Every day | CUTANEOUS | 0 refills | Status: DC

## 2020-03-28 MED ORDER — HYDROCODONE-ACETAMINOPHEN 5-325 MG PO TABS
1.0000 | ORAL_TABLET | Freq: Once | ORAL | Status: AC
  Administered 2020-03-28: 1 via ORAL
  Filled 2020-03-28: qty 1
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
## 2020-03-28 NOTE — Discharge Instructions (Signed)
If you are unable to get in to see the surgeon, return in 3- days for a recheck.

## 2020-03-29 MED FILL — Hydrocodone-Acetaminophen Tab 5-325 MG: ORAL | Qty: 6 | Status: AC

## 2021-02-24 ENCOUNTER — Other Ambulatory Visit: Payer: Self-pay

## 2021-02-24 ENCOUNTER — Encounter (HOSPITAL_COMMUNITY): Payer: Self-pay

## 2021-02-24 ENCOUNTER — Emergency Department (HOSPITAL_COMMUNITY)
Admission: EM | Admit: 2021-02-24 | Discharge: 2021-02-24 | Disposition: A | Discharge status: Home / Self Care | Payer: Self-pay | Attending: Emergency Medicine | Admitting: Emergency Medicine

## 2021-02-24 DIAGNOSIS — J101 Influenza due to other identified influenza virus with other respiratory manifestations: Secondary | ICD-10-CM | POA: Insufficient documentation

## 2021-02-24 DIAGNOSIS — Z20822 Contact with and (suspected) exposure to covid-19: Secondary | ICD-10-CM | POA: Insufficient documentation

## 2021-02-24 DIAGNOSIS — J111 Influenza due to unidentified influenza virus with other respiratory manifestations: Secondary | ICD-10-CM

## 2021-02-24 DIAGNOSIS — F17210 Nicotine dependence, cigarettes, uncomplicated: Secondary | ICD-10-CM | POA: Insufficient documentation

## 2021-02-24 DIAGNOSIS — R000 Tachycardia, unspecified: Secondary | ICD-10-CM | POA: Insufficient documentation

## 2021-02-24 LAB — RESP PANEL BY RT-PCR (FLU A&B, COVID) ARPGX2
Influenza A by PCR: POSITIVE — AB
Influenza B by PCR: NEGATIVE
SARS Coronavirus 2 by RT PCR: NEGATIVE

## 2021-02-24 MED ORDER — OSELTAMIVIR PHOSPHATE 75 MG PO CAPS
75.0000 mg | ORAL_CAPSULE | Freq: Two times a day (BID) | ORAL | 0 refills | Status: DC
Start: 2021-02-24 — End: 2021-07-08

## 2021-02-24 NOTE — Discharge Instructions (Signed)
You were seen today for evaluation of your flulike symptoms.  You tested positive for flu.  Work note has been provided.  You have been sent in Tamiflu to take to lessen the severity of your symptoms.  In addition to the Tamiflu, please take over-the-counter cough and cold medication.  It is important to stay well-hydrated with fluids, mainly water.  Can return to work when you have been fever free for 24 hours without the use of antipyretics.  Please obtain plenty of rest.  If you have any concern, new or worsening symptoms, please return to the nearest emergency department for reevaluation.

## 2021-02-24 NOTE — ED Provider Notes (Signed)
[facility] EMERGENCY DEPARTMENT Provider Note   CSN: [id number] Arrival date & time: 02/24/21  1349     History Chief Complaint  Patient presents with   Generalized Body Aches    Colleen Hamilton is a 30 y.o. female otherwise healthy presents the emergency department for nasal congestion, sore throat, subjective fever and chills, body aches, headache, sneezing, and cough since yesterday.  Patient reports that her daughter and grandmother recently tested positive for flu and she is been taking care of them.  She denies any chest pain, shortness of breath, nausea, vomiting, abdominal pain, diarrhea.  No over-the-counter medication has been trialed.  She denies any medical or surgical history.  Denies any daily medications.  No known drug allergies.  Patient is a daily smoker.  Denies any EtOH or drug use.  HPI     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 (HPV) DNA test positive 07/21/2016   Medical history non-contributory     Patient Active Problem List   Diagnosis Date Noted   Cervical high risk human papillomavirus (HPV) DNA test positive 07/21/2016   Encounter for routine gynecological examination with Papanicolaou smear of cervix 07/15/2016   Screening for STD (sexually transmitted disease) 07/15/2016   Encounter for initial prescription of contraceptive pills 07/15/2016   Contraceptive education 07/15/2016    Past Surgical History:  Procedure Laterality Date   INDUCED ABORTION       OB History     Gravida  3   Para  1   Term  0   Preterm  0   AB  1   Living  1      SAB      IAB  1   Ectopic  0   Multiple  0   Live Births  1           Family History  Problem Relation Age of Onset   Hypertension Mother     Social History   Tobacco Use   Smoking status: Every Day    Packs/day: 0.01    Types: Cigars, Cigarettes   Smokeless tobacco: Never  Vaping Use   Vaping Use: Never used  Substance Use  Topics   Alcohol use: Yes    Alcohol/week: 1.0 standard drink    Types: 1 Shots of liquor per week   Drug use: Yes    Types: Marijuana    Home Medications Prior to Admission medications   Medication Sig Start Date End Date Taking? Authorizing Provider  oseltamivir (TAMIFLU) 75 MG capsule Take 1 capsule (75 mg total) by mouth every 12 (twelve) hours. 02/24/21  Yes Sherrell Puller, PA-C  HYDROcodone-acetaminophen (NORCO) 5-325 MG tablet Take 1 tablet by mouth every 4 (four) hours as needed for moderate pain. [id number]   Delora Fuel, MD  HYDROcodone-acetaminophen (NORCO) 5-325 MG tablet Take 1 tablet by mouth every 4 (four) hours as needed for moderate pain. [id number]   Delora Fuel, MD  silver sulfADIAZINE (SILVADENE) 1 % cream Apply 1 application topically daily. [id number]   Delora Fuel, MD    Allergies    Patient has no known allergies.  Review of Systems   Review of Systems  Constitutional:  Positive for chills and fever.  HENT:  Positive for congestion, rhinorrhea, sneezing and sore throat. Negative for drooling, ear pain and trouble swallowing.   Eyes:  Negative for photophobia, discharge and visual disturbance.  Respiratory:  Positive for cough. Negative for shortness of breath.  Cardiovascular:  Negative for chest pain and palpitations.  Gastrointestinal:  Negative for abdominal pain, diarrhea, nausea and vomiting.  Genitourinary:  Negative for dysuria and hematuria.  Musculoskeletal:  Positive for myalgias. Negative for arthralgias, back pain and joint swelling.  Skin:  Negative for color change and rash.  Neurological:  Positive for headaches. Negative for syncope and weakness.   Physical Exam Updated Vital Signs BP 136/79 (BP Location: Left Arm)   Pulse (!) 102   Temp 99.5 F (37.5 C) (Oral)   Resp 17   Ht 5' 2.5" (1.588 m)   Wt 119.3 kg   LMP 02/17/2021   SpO2 97%   BMI 47.34 kg/m   Physical Exam Constitutional:      General: She is not in acute distress.     Appearance: Normal appearance. She is not toxic-appearing.  HENT:     Head: Normocephalic and atraumatic.     Nose:     Comments: Bilateral nasal turbinate erythema and edema with scant clear nasal discharge    Mouth/Throat:     Mouth: Mucous membranes are moist.     Pharynx: Oropharynx is clear. No oropharyngeal exudate or posterior oropharyngeal erythema.     Comments: No pharyngeal erythema, edema, or exudate noted.  Uvula midline.  Airway patent.  Moist membranes. Eyes:     General: No scleral icterus.    Conjunctiva/sclera: Conjunctivae normal.  Cardiovascular:     Rate and Rhythm: Regular rhythm. Tachycardia present.  Pulmonary:     Effort: Pulmonary effort is normal. No respiratory distress.     Breath sounds: Normal breath sounds. No wheezing or rhonchi.     Comments: Lungs clear to auscultation bilaterally.  No respiratory distress, accessory muscle use, tripoding, cyanosis, or nasal flaring present.  Patient speaking in full sentences with ease. Abdominal:     General: Abdomen is flat. Bowel sounds are normal.     Palpations: Abdomen is soft.     Tenderness: There is no abdominal tenderness. There is no guarding or rebound.  Musculoskeletal:        General: No deformity.     Cervical back: Normal range of motion.  Skin:    General: Skin is warm and dry.  Neurological:     General: No focal deficit present.     Mental Status: She is alert. Mental status is at baseline.    ED Results / Procedures / Treatments   Labs (all labs ordered are listed, but only abnormal results are displayed) Labs Reviewed  RESP PANEL BY RT-PCR (FLU A&B, COVID) ARPGX2 - Abnormal; Notable for the following components:      Result Value   Influenza A by PCR POSITIVE (*)    All other components within normal limits    EKG None  Radiology No results found.  Procedures Procedures   Medications Ordered in ED Medications - No data to display  ED Course  I have reviewed the triage  vital signs and the nursing notes.  Pertinent labs & imaging results that were available during my care of the patient were reviewed by me and considered in my medical decision making (see chart for details).  Old female presents emergency department with flulike symptoms since yesterday.  Patient recently taking care of her sick family members with flu.  Physical exam remarkable other than some bilateral nasal erythema and edema.  Vital signs show the patient has mild tachycardia with a high normal temperature of 99.28F.  Patient satting well on room air with  normal respiratory rate.  Patient initially mildly hypertensive but has improved to 136/79.  The patient has a positive for influenza A, negative for COVID.  Discussed Tamiflu risks and benefits with patient.  She is interested.  She declines any Tessalon Perles at this time.  Supportive care measures discussed with patient.  I recommended over-the-counter cough and cold medication in addition to plenty of rest and fluids.  Return precautions discussed.  Patient agrees to plan.  Patient is stable and being discharged home in good condition.    MDM Rules/Calculators/A&P                          Final Clinical Impression(s) / ED Diagnoses Final diagnoses:  Influenza    Rx / DC Orders ED Discharge Orders          Ordered    oseltamivir (TAMIFLU) 75 MG capsule  Every 12 hours        02/24/21 2215             Sherrell Puller, PA-C 02/24/21 2216    Milton Ferguson, MD 02/25/21 1431

## 2021-02-24 NOTE — ED Triage Notes (Signed)
Pt presents to ED with complaints of generalized body aches, chills, nasal congestion started yesterday. Daughter had flu recently

## 2021-06-17 ENCOUNTER — Other Ambulatory Visit: Payer: Self-pay | Admitting: Adult Health

## 2021-07-08 ENCOUNTER — Ambulatory Visit: Payer: No Typology Code available for payment source | Admitting: Obstetrics & Gynecology

## 2021-07-08 ENCOUNTER — Encounter: Payer: Self-pay | Admitting: Obstetrics & Gynecology

## 2021-07-08 ENCOUNTER — Other Ambulatory Visit (HOSPITAL_COMMUNITY)
Admission: RE | Admit: 2021-07-08 | Discharge: 2021-07-08 | Disposition: A | Discharge status: Home / Self Care | Payer: No Typology Code available for payment source | Source: Ambulatory Visit | Attending: Obstetrics & Gynecology | Admitting: Obstetrics & Gynecology

## 2021-07-08 VITALS — BP 132/81 | HR 99 | Ht 64.0 in | Wt 279.0 lb

## 2021-07-08 DIAGNOSIS — Z124 Encounter for screening for malignant neoplasm of cervix: Secondary | ICD-10-CM | POA: Insufficient documentation

## 2021-07-08 DIAGNOSIS — N938 Other specified abnormal uterine and vaginal bleeding: Secondary | ICD-10-CM | POA: Diagnosis not present

## 2021-07-08 DIAGNOSIS — Z113 Encounter for screening for infections with a predominantly sexual mode of transmission: Secondary | ICD-10-CM

## 2021-07-08 DIAGNOSIS — Z3202 Encounter for pregnancy test, result negative: Secondary | ICD-10-CM

## 2021-07-08 LAB — POCT URINE PREGNANCY: Preg Test, Ur: NEGATIVE

## 2021-07-08 MED ORDER — MEGESTROL ACETATE 40 MG PO TABS: ORAL_TABLET | ORAL | 0 refills | Status: AC

## 2021-07-08 NOTE — Progress Notes (Signed)
? ? ? ? ?Chief Complaint  ?Patient presents with  ? Vaginal Bleeding  ?  Needs pap last done 07/15/16   ? ? ? ? ?31 y.o. G3P0011 Patient's last menstrual period was 07/01/2021. The current method of family planning is none. ? ?Outpatient Encounter Medications as of 07/08/2021  ?Medication Sig  ? megestrol (MEGACE) 40 MG tablet 3 tablets a day for 5 days, 2 tablets a day for 5 days then 1 tablet daily  ? [DISCONTINUED] HYDROcodone-acetaminophen (NORCO) 5-325 MG tablet Take 1 tablet by mouth every 4 (four) hours as needed for moderate pain.  ? [DISCONTINUED] HYDROcodone-acetaminophen (NORCO) 5-325 MG tablet Take 1 tablet by mouth every 4 (four) hours as needed for moderate pain.  ? [DISCONTINUED] oseltamivir (TAMIFLU) 75 MG capsule Take 1 capsule (75 mg total) by mouth every 12 (twelve) hours.  ? [DISCONTINUED] silver sulfADIAZINE (SILVADENE) 1 % cream Apply 1 application topically daily.  ? ?No facility-administered encounter medications on file as of 07/08/2021.  ? ? ?Subjective ?Colleen Hamilton presents today for problematic periods over the last 6 months or so ? ?Historically she has pretty regular periods with 5 days of bleeding some cramping just normal heaviness as far as she is concerned and pretty regular ? ?However 6 months ago she went 3 months without a period at all ?Now over the last 90 days she has had 88 days of light red spotting ?Minimal cramping ?No bright red or at least not persistent no clots no brown or dark  ?And this is never happened before ?She has not spent any significant time in her life ?Birth control methods ? ?Past Medical History:  ?Diagnosis Date  ? Cervical high risk human papillomavirus (HPV) DNA test positive 07/21/2016  ? Medical history non-contributory   ? ? ?Past Surgical History:  ?Procedure Laterality Date  ? INDUCED ABORTION    ? ? ?OB History   ? ? Gravida  ?3  ? Para  ?1  ? Term  ?0  ? Preterm  ?0  ? AB  ?1  ? Living  ?1  ?  ? ? SAB  ?   ? IAB  ?1  ? Ectopic  ?0  ? Multiple   ?0  ? Live Births  ?1  ?   ?  ?  ? ? ?No Known Allergies ? ?Social History  ? ?Socioeconomic History  ? Marital status: Single  ?  Spouse name: Not on file  ? Number of children: Not on file  ? Years of education: Not on file  ? Highest education level: Not on file  ?Occupational History  ? Not on file  ?Tobacco Use  ? Smoking status: Every Day  ?  Packs/day: 0.01  ?  Types: Cigars, Cigarettes  ? Smokeless tobacco: Never  ?Vaping Use  ? Vaping Use: Never used  ?Substance and Sexual Activity  ? Alcohol use: Yes  ?  Alcohol/week: 1.0 standard drink  ?  Types: 1 Shots of liquor per week  ? Drug use: Yes  ?  Types: Marijuana  ? Sexual activity: Yes  ?  Birth control/protection: None  ?Other Topics Concern  ? Not on file  ?Social History Narrative  ? Not on file  ? ?Social Determinants of Health  ? ?Financial Resource Strain: Low Risk   ? Difficulty of Paying Living Expenses: Not very hard  ?Food Insecurity: No Food Insecurity  ? Worried About Programme researcher, broadcasting/film/video in the Last Year: Never true  ? Ran Out  of Food in the Last Year: Never true  ?Transportation Needs: No Transportation Needs  ? Lack of Transportation (Medical): No  ? Lack of Transportation (Non-Medical): No  ?Physical Activity: Inactive  ? Days of Exercise per Week: 7 days  ? Minutes of Exercise per Session: 0 min  ?Stress: No Stress Concern Present  ? Feeling of Stress : Not at all  ?Social Connections: Moderately Integrated  ? Frequency of Communication with Friends and Family: More than three times a week  ? Frequency of Social Gatherings with Friends and Family: More than three times a week  ? Attends Religious Services: More than 4 times per year  ? Active Member of Clubs or Organizations: No  ? Attends Banker Meetings: Never  ? Marital Status: Living with partner  ? ? ?Family History  ?Problem Relation Age of Onset  ? Hypertension Mother   ? ? ?Medications:       ?Current Outpatient Medications:  ?  megestrol (MEGACE) 40 MG tablet, 3  tablets a day for 5 days, 2 tablets a day for 5 days then 1 tablet daily, Disp: 45 tablet, Rfl: 0 ? ?Objective ?Blood pressure 132/81, pulse 99, height 5\' 4"  (1.626 m), weight 279 lb (126.6 kg), last menstrual period 07/01/2021, not currently breastfeeding. ? ?General WDWN female NAD ?Vulva:  normal appearing vulva with no masses, tenderness or lesions ?Vagina:  normal mucosa, no discharge ?Cervix:  Normal no lesions ?Uterus:  normal size, contour, position, consistency, mobility, non-tender ?Adnexa: ovaries:present,  normal adnexa in size, nontender and no masses ? ? ?Pertinent ROS ?No burning with urination, frequency or urgency ?No nausea, vomiting or diarrhea ?Nor fever chills or other constitutional symptoms ? ? ?Labs or studies ?none ? ? ? ?Impression + Management Plan: ?Diagnoses this Encounter:: ?  ICD-10-CM   ?1. DUB, first episode  N93.8 POCT urine pregnancy  ? 3 months no cycle, 1st time, then light red spotting 88/90 days.  Megestrol algorithm then withdraw, no therapy at that point  ?  ?2. Routine cervical smear  Z12.4 Cytology - PAP( [facility])  ?  ?3. Screen for STD (sexually transmitted disease)  Z11.3 Cytology - PAP( [facility])  ?  ? ? ? ? ?Medications prescribed during  this encounter: ?Meds ordered this encounter  ?Medications  ? megestrol (MEGACE) 40 MG tablet  ?  Sig: 3 tablets a day for 5 days, 2 tablets a day for 5 days then 1 tablet daily  ?  Dispense:  45 tablet  ?  Refill:  0  ? ? ?Labs or Scans Ordered during this encounter: ?Orders Placed This Encounter  ?Procedures  ? POCT urine pregnancy  ? ? ? ? ?Follow up ?Return in about 3 years (around 07/08/2024), or if symptoms worsen or fail to improve. ? ? ? ? ? ? ?

## 2021-07-09 LAB — CYTOLOGY - PAP
Chlamydia: NEGATIVE
Comment: NEGATIVE
Comment: NEGATIVE
Comment: NORMAL
Diagnosis: NEGATIVE
High risk HPV: NEGATIVE
Neisseria Gonorrhea: NEGATIVE

## 2021-07-15 ENCOUNTER — Ambulatory Visit: Payer: Self-pay | Admitting: Obstetrics & Gynecology

## 2021-07-25 ENCOUNTER — Ambulatory Visit: Payer: No Typology Code available for payment source | Admitting: Family Medicine

## 2021-08-11 ENCOUNTER — Ambulatory Visit (INDEPENDENT_AMBULATORY_CARE_PROVIDER_SITE_OTHER): Payer: No Typology Code available for payment source | Admitting: Family Medicine

## 2021-08-11 ENCOUNTER — Encounter: Payer: Self-pay | Admitting: Family Medicine

## 2021-08-11 VITALS — BP 122/62 | HR 95 | Ht 63.0 in | Wt 274.4 lb

## 2021-08-11 DIAGNOSIS — Z789 Other specified health status: Secondary | ICD-10-CM

## 2021-08-11 DIAGNOSIS — R7301 Impaired fasting glucose: Secondary | ICD-10-CM

## 2021-08-11 DIAGNOSIS — E559 Vitamin D deficiency, unspecified: Secondary | ICD-10-CM | POA: Diagnosis not present

## 2021-08-11 DIAGNOSIS — E669 Obesity, unspecified: Secondary | ICD-10-CM

## 2021-08-11 DIAGNOSIS — Z1159 Encounter for screening for other viral diseases: Secondary | ICD-10-CM

## 2021-08-11 NOTE — Patient Instructions (Signed)
I appreciate the opportunity to provide care to you today! ?  ?Follow up: 3 months ? ?Labs: please stop by the lab anytime during the week to have your blood drawn ? ? ?I recommend lifestyle changes, and we will reassess at her next appt. Lifestyle changes include weight loss and exercising at least 3 days a week for 150 min weekly. I recommend decreasing carbs and food high in sugar from her diet.  ? ?  ?It was a pleasure to see you and I look forward to continuing to work together on your health and well-being. ?Please do not hesitate to call the office if you need care or have questions about your care. ?  ?Have a wonderful day and week. ?With Gratitude, ?Gilmore Laroche MSN, FNP-BC ? ?

## 2021-08-11 NOTE — Progress Notes (Addendum)
? ?New Patient Office Visit ? ?Subjective:  ?Patient ID: Colleen Hamilton, female    DOB: 20-Jun-1990  Age: 31 y.o. MRN: [id number] ? ?CC:  ?Chief Complaint  ?Patient presents with  ? New Patient (Initial Visit)  ?  Pt has menstrual irregularity, for about six months now.   ? ? ?HPI ?Colleen Hamilton is a 31 y.o. female with past medical history of DUB and obesity presents for establishing care. She will like to discuss weight loss options.  ? ? ? ? ? ? ?Past Medical History:  ?Diagnosis Date  ? Cervical high risk human papillomavirus (HPV) DNA test positive 07/21/2016  ? Medical history non-contributory   ? ? ?Past Surgical History:  ?Procedure Laterality Date  ? INDUCED ABORTION    ? ? ?Family History  ?Problem Relation Age of Onset  ? Hypertension Mother   ? ? ?Social History  ? ?Socioeconomic History  ? Marital status: Single  ?  Spouse name: Not on file  ? Number of children: Not on file  ? Years of education: Not on file  ? Highest education level: Not on file  ?Occupational History  ? Not on file  ?Tobacco Use  ? Smoking status: Every Day  ?  Packs/day: 0.01  ?  Types: Cigars, Cigarettes  ? Smokeless tobacco: Never  ?Vaping Use  ? Vaping Use: Never used  ?Substance and Sexual Activity  ? Alcohol use: Yes  ?  Alcohol/week: 1.0 standard drink  ?  Types: 1 Shots of liquor per week  ? Drug use: Yes  ?  Types: Marijuana  ? Sexual activity: Yes  ?  Birth control/protection: None  ?Other Topics Concern  ? Not on file  ?Social History Narrative  ? Not on file  ? ?Social Determinants of Health  ? ?Financial Resource Strain: Low Risk   ? Difficulty of Paying Living Expenses: Not very hard  ?Food Insecurity: No Food Insecurity  ? Worried About Charity fundraiser in the Last Year: Never true  ? Ran Out of Food in the Last Year: Never true  ?Transportation Needs: No Transportation Needs  ? Lack of Transportation (Medical): No  ? Lack of Transportation (Non-Medical): No  ?Physical Activity: Inactive  ? Days of Exercise per  Week: 7 days  ? Minutes of Exercise per Session: 0 min  ?Stress: No Stress Concern Present  ? Feeling of Stress : Not at all  ?Social Connections: Moderately Integrated  ? Frequency of Communication with Friends and Family: More than three times a week  ? Frequency of Social Gatherings with Friends and Family: More than three times a week  ? Attends Religious Services: More than 4 times per year  ? Active Member of Clubs or Organizations: No  ? Attends Archivist Meetings: Never  ? Marital Status: Living with partner  ?Intimate Partner Violence: Not At Risk  ? Fear of Current or Ex-Partner: No  ? Emotionally Abused: No  ? Physically Abused: No  ? Sexually Abused: No  ? ? ?ROS ?Review of Systems  ?Constitutional:  Negative for chills, fatigue and fever.  ?HENT:  Negative for rhinorrhea, sinus pressure, sinus pain and sore throat.   ?Eyes:  Negative for pain, redness, itching and visual disturbance.  ?Respiratory:  Negative for cough, chest tightness and shortness of breath.   ?Cardiovascular:  Negative for chest pain and palpitations.  ?Gastrointestinal:  Negative for constipation, diarrhea, nausea and vomiting.  ?Endocrine: Negative for polydipsia, polyphagia and polyuria.  ?Genitourinary:  Negative  for frequency and urgency.  ?Musculoskeletal:  Negative for back pain and neck pain.  ?Skin:  Negative for rash and wound.  ?Neurological:  Negative for dizziness, tremors, weakness and headaches.  ?Psychiatric/Behavioral:  Negative for confusion, self-injury and sleep disturbance.   ? ?Objective:  ? ?Today's Vitals: BP 122/62   Pulse 95   Ht _0  (1.6 m)   Wt 274 lb 6.4 oz (124.5 kg)   SpO2 98%   BMI 48.61 kg/m?  ? ?Physical Exam ?Constitutional:   ?   Appearance: She is obese.  ?HENT:  ?   Head: Normocephalic.  ?   Right Ear: External ear normal.  ?   Left Ear: External ear normal.  ?   Nose: No congestion or rhinorrhea.  ?Eyes:  ?   General:     ?   Right eye: No discharge.     ?   Left eye: No  discharge.  ?   Extraocular Movements: Extraocular movements intact.  ?   Pupils: Pupils are equal, round, and reactive to light.  ?Cardiovascular:  ?   Rate and Rhythm: Normal rate and regular rhythm.  ?   Pulses: Normal pulses.  ?   Heart sounds: Normal heart sounds.  ?Pulmonary:  ?   Effort: Pulmonary effort is normal.  ?   Breath sounds: Normal breath sounds.  ?Abdominal:  ?   Palpations: Abdomen is soft.  ?   Tenderness: There is no right CVA tenderness or left CVA tenderness.  ?Musculoskeletal:  ?   Cervical back: Normal range of motion.  ?   Right lower leg: No edema.  ?   Left lower leg: No edema.  ?Skin: ?   Findings: No bruising or erythema.  ?Neurological:  ?   Mental Status: She is alert and oriented to person, place, and time.  ?Psychiatric:  ?   Comments: Normal affect  ? ? ?Assessment & Plan:  ? ?Problem List Items Addressed This Visit   ? ?  ? Other  ? Weight loss advised  ?  -Discuss lifestyle modifications, including healthy eating habits and increasing physical activities. ?- Also discuss intermittent fasting, weight loss pills, and injections ?-Patient will implement lifestyle changes first and look into other options after 3 months. ? ?  ?  ? ?Other Visit Diagnoses   ? ? IFG (impaired fasting glucose)    -  Primary  ? Relevant Orders  ? Hemoglobin A1C  ? Need for hepatitis C screening test      ? Relevant Orders  ? Hepatitis C antibody  ? Vitamin D deficiency      ? Relevant Orders  ? Vitamin D (25 hydroxy)  ? Obesity (BMI 35.0-39.9 without comorbidity)      ? Relevant Orders  ? CBC with Differential/Platelet  ? CMP14+EGFR  ? TSH + free T4  ? Lipid panel  ? ?  ? ? ?Outpatient Encounter Medications as of 08/11/2021  ?Medication Sig  ? megestrol (MEGACE) 40 MG tablet 3 tablets a day for 5 days, 2 tablets a day for 5 days then 1 tablet daily (Patient not taking: Reported on 08/11/2021)  ? ?No facility-administered encounter medications on file as of 08/11/2021.  ? ? ?Follow-up: Return in about 3 months  (around 11/11/2021).  ? ?Alvira Monday, FNP ?

## 2021-08-12 DIAGNOSIS — Z789 Other specified health status: Secondary | ICD-10-CM | POA: Insufficient documentation

## 2021-08-12 NOTE — Assessment & Plan Note (Signed)
-  Discuss lifestyle modifications, including healthy eating habits and increasing physical activities. ?- Also discuss intermittent fasting, weight loss pills, and injections ?-Patient will implement lifestyle changes first and look into other options after 3 months. ?

## 2021-11-11 ENCOUNTER — Ambulatory Visit: Payer: No Typology Code available for payment source | Admitting: Family Medicine

## 2022-12-08 ENCOUNTER — Ambulatory Visit: Payer: Medicaid Other | Admitting: Family Medicine

## 2022-12-09 ENCOUNTER — Encounter: Payer: Self-pay | Admitting: Family Medicine

## 2023-01-07 ENCOUNTER — Other Ambulatory Visit: Payer: Self-pay

## 2023-01-07 ENCOUNTER — Ambulatory Visit
Admission: RE | Admit: 2023-01-07 | Discharge: 2023-01-07 | Disposition: A | Discharge status: Home / Self Care | Payer: Medicaid Other | Source: Ambulatory Visit | Attending: Nurse Practitioner | Admitting: Nurse Practitioner

## 2023-01-07 VITALS — BP 138/89 | HR 99 | Temp 98.0°F | Resp 18

## 2023-01-07 DIAGNOSIS — Z1152 Encounter for screening for COVID-19: Secondary | ICD-10-CM | POA: Diagnosis not present

## 2023-01-07 DIAGNOSIS — J069 Acute upper respiratory infection, unspecified: Secondary | ICD-10-CM

## 2023-01-07 LAB — POCT INFLUENZA A/B
Influenza A, POC: NEGATIVE
Influenza B, POC: NEGATIVE

## 2023-01-07 MED ORDER — FLUTICASONE PROPIONATE 50 MCG/ACT NA SUSP: 2.0000 | Freq: Every day | NASAL | 0 refills | Status: AC

## 2023-01-07 MED ORDER — PSEUDOEPH-BROMPHEN-DM 30-2-10 MG/5ML PO SYRP: 5.0000 mL | ORAL_SOLUTION | Freq: Four times a day (QID) | ORAL | 0 refills | Status: AC | PRN

## 2023-01-07 MED ORDER — CETIRIZINE HCL 10 MG PO TABS: 10.0000 mg | ORAL_TABLET | Freq: Every day | ORAL | 0 refills | Status: AC

## 2023-01-07 NOTE — ED Triage Notes (Signed)
Pt reports she has a "bad cold", fatigued, nasal congestion, and headaches x 3 days .   Exposed to Covid  Exposed to flue

## 2023-01-07 NOTE — ED Provider Notes (Signed)
[facility]    CSN: [id number] Arrival date & time: 01/07/23  1736      History   Chief Complaint Chief Complaint  Patient presents with   URI    HPI Colleen Hamilton is a 32 y.o. female.   The history is provided by the patient.   Patient presents with a 3-day history of fatigue, nasal congestion, cough and headaches.  Patient states that she did have a fever 1 day ago, was not able to check it at home.  Patient denies sore throat, wheezing, difficulty breathing, chest pain, abdominal pain, nausea, vomiting, or diarrhea.  Patient reports that her daughter was sick last week and now she is having symptoms.  Patient has been taking over-the-counter cough and cold medication with minimal relief.  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 (HPV) DNA test positive 07/21/2016   Encounter for routine gynecological examination with Papanicolaou smear of cervix 07/15/2016   Screening for STD (sexually transmitted disease) 07/15/2016   Encounter for initial prescription of contraceptive pills 07/15/2016   Contraceptive education 07/15/2016    Past Surgical History:  Procedure Laterality Date   INDUCED ABORTION      OB History     Gravida  3   Para  1   Term  0   Preterm  0   AB  1   Living  1      SAB      IAB  1   Ectopic  0   Multiple  0   Live Births  1            Home Medications    Prior to Admission medications   Medication Sig Start Date End Date Taking? Authorizing Provider  brompheniramine-pseudoephedrine-DM 30-2-10 MG/5ML syrup Take 5 mLs by mouth 4 (four) times daily as needed. 01/07/23  Yes Kandon Hosking-Warren, Sadie Haber, NP  cetirizine (ZYRTEC) 10 MG tablet Take 1 tablet (10 mg total) by mouth  daily. 01/07/23  Yes Jawanna Dykman-Warren, Sadie Haber, NP  fluticasone (FLONASE) 50 MCG/ACT nasal spray Place 2 sprays into both nostrils daily. 01/07/23  Yes Corissa Oguinn-Warren, Sadie Haber, NP  megestrol (MEGACE) 40 MG tablet 3 tablets a day for 5 days, 2 tablets a day for 5 days then 1 tablet daily Patient not taking: Reported on 08/11/2021 07/08/21   Lazaro Arms, MD    Family History Family History  Problem Relation Age of Onset   Hypertension Mother     Social History Social History   Tobacco Use   Smoking status: Every Day    Current packs/day: 0.01    Types: Cigars, Cigarettes   Smokeless tobacco: Never  Vaping Use   Vaping status: Never Used  Substance Use Topics   Alcohol use: Yes    Alcohol/week: 1.0 standard drink of alcohol    Types: 1 Shots of liquor per week   Drug use: Yes    Types: Marijuana     Allergies   Patient has no known allergies.   Review of Systems Review of Systems Per HPI  Physical Exam Triage Vital Signs ED Triage Vitals  Encounter Vitals Group     BP 01/07/23 1745 138/89     Systolic BP Percentile --      Diastolic  BP Percentile --      Pulse Rate 01/07/23 1745 99     Resp 01/07/23 1745 18     Temp 01/07/23 1745 98 F (36.7 C)     Temp Source 01/07/23 1745 Oral     SpO2 01/07/23 1745 97 %     Weight --      Height --      Head Circumference --      Peak Flow --      Pain Score 01/07/23 1747 5     Pain Loc --      Pain Education --      Exclude from Growth Chart --    No data found.  Updated Vital Signs BP 138/89 (BP Location: Right Arm)   Pulse 99   Temp 98 F (36.7 C) (Oral)   Resp 18   LMP 11/20/2022 Comment: end date  SpO2 97%   Visual Acuity Right Eye Distance:   Left Eye Distance:   Bilateral Distance:    Right Eye Near:   Left Eye Near:    Bilateral Near:     Physical Exam Vitals and nursing note reviewed.  Constitutional:      General: She is not in acute distress.    Appearance: Normal appearance.  HENT:      Head: Normocephalic.     Right Ear: Tympanic membrane, ear canal and external ear normal.     Left Ear: Tympanic membrane, ear canal and external ear normal.     Nose: Congestion present.     Right Turbinates: Enlarged and swollen.     Left Turbinates: Enlarged and swollen.     Right Sinus: No maxillary sinus tenderness or frontal sinus tenderness.     Left Sinus: No maxillary sinus tenderness or frontal sinus tenderness.     Mouth/Throat:     Lips: Pink.     Mouth: Mucous membranes are moist.     Pharynx: Uvula midline. Posterior oropharyngeal erythema and postnasal drip present. No pharyngeal swelling, oropharyngeal exudate or uvula swelling.     Comments: Cobblestoning present to posterior oropharynx Eyes:     Extraocular Movements: Extraocular movements intact.     Conjunctiva/sclera: Conjunctivae normal.     Pupils: Pupils are equal, round, and reactive to light.  Cardiovascular:     Rate and Rhythm: Normal rate and regular rhythm.     Pulses: Normal pulses.     Heart sounds: Normal heart sounds.  Pulmonary:     Effort: Pulmonary effort is normal. No respiratory distress.     Breath sounds: Normal breath sounds. No stridor. No wheezing, rhonchi or rales.  Abdominal:     General: Bowel sounds are normal.     Palpations: Abdomen is soft.     Tenderness: There is no abdominal tenderness.  Musculoskeletal:     Cervical back: Normal range of motion.  Lymphadenopathy:     Cervical: No cervical adenopathy.  Skin:    General: Skin is warm and dry.  Neurological:     General: No focal deficit present.     Mental Status: She is alert and oriented to person, place, and time.  Psychiatric:        Mood and Affect: Mood normal.        Behavior: Behavior normal.      UC Treatments / Results  Labs (all labs ordered are listed, but only abnormal results are displayed) Labs Reviewed  POCT INFLUENZA A/B    EKG   Radiology No  results found.  Procedures Procedures (including  critical care time)  Medications Ordered in UC Medications - No data to display  Initial Impression / Assessment and Plan / UC Course  I have reviewed the triage vital signs and the nursing notes.  Pertinent labs & imaging results that were available during my care of the patient were reviewed by me and considered in my medical decision making (see chart for details).  The patient is well-appearing, she is in no acute distress, vital signs are stable.  The influenza test was negative.  A COVID test is pending.  Patient is able to receive Paxlovid if her COVID test is positive.  Suspect a viral upper respiratory infection with cough.  Will treat symptomatically with fluticasone 50 micro nasal spray for nasal congestion, Bromfed-DM for nasal congestion and cough, and cetirizine 10 mg for nasal congestion.  Supportive care recommendations were provided and discussed with the patient to include increasing fluids, allowing for plenty of rest, over-the-counter analgesics, normal saline nasal spray, and warm salt water gargles.  Discussed viral etiology with the patient and when follow-up may be indicated.  Also discussed with patient that if her test is positive for COVID, she should remain home until she has been fever free for 24 hours with no medication.  Patient is in agreement with this plan of care and verbalizes understanding.  All questions were answered.  Patient stable for discharge.  Work note was provided.  Final Clinical Impressions(s) / UC Diagnoses   Final diagnoses:  Viral upper respiratory tract infection with cough  Encounter for screening for COVID-19     Discharge Instructions      The influenza test was negative.  A COVID test is pending.  You will be contacted if the pending test result is positive.  You will also have access to result via MyChart. Take medication as prescribed. Increase fluids and allow for plenty of rest. May use normal saline nasal spray throughout the  day to help with nasal congestion and runny nose. For your cough, recommend using a humidifier in the bedroom at nighttime during sleep and sleeping elevated on pillows while cough symptoms persist. Please be advised that viral infections can last from 10 to 14 days.  If you experience worsening symptoms, or if symptoms extend beyond that time, please follow-up in this clinic or with your primary care physician for further evaluation. Follow-up as needed.     ED Prescriptions     Medication Sig Dispense Auth. Provider   fluticasone (FLONASE) 50 MCG/ACT nasal spray Place 2 sprays into both nostrils daily. 16 g Ailana Cuadrado-Warren, Sadie Haber, NP   brompheniramine-pseudoephedrine-DM 30-2-10 MG/5ML syrup Take 5 mLs by mouth 4 (four) times daily as needed. 140 mL Wandalene Abrams-Warren, Sadie Haber, NP   cetirizine (ZYRTEC) 10 MG tablet Take 1 tablet (10 mg total) by mouth daily. 30 tablet Maire Govan-Warren, Sadie Haber, NP      PDMP not reviewed this encounter.   Abran Cantor, NP 01/07/23 1816

## 2023-01-07 NOTE — Discharge Instructions (Addendum)
The influenza test was negative.  A COVID test is pending.  You will be contacted if the pending test result is positive.  You will also have access to result via MyChart. Take medication as prescribed. Increase fluids and allow for plenty of rest. May use normal saline nasal spray throughout the day to help with nasal congestion and runny nose. For your cough, recommend using a humidifier in the bedroom at nighttime during sleep and sleeping elevated on pillows while cough symptoms persist. Please be advised that viral infections can last from 10 to 14 days.  If you experience worsening symptoms, or if symptoms extend beyond that time, please follow-up in this clinic or with your primary care physician for further evaluation. Follow-up as needed.

## 2023-01-08 LAB — SARS CORONAVIRUS 2 (TAT 6-24 HRS): SARS Coronavirus 2: NEGATIVE

## 2023-04-06 ENCOUNTER — Ambulatory Visit
Admission: RE | Admit: 2023-04-06 | Discharge: 2023-04-06 | Disposition: A | Discharge status: Home / Self Care | Payer: Medicaid Other | Source: Ambulatory Visit | Attending: Nurse Practitioner | Admitting: Nurse Practitioner

## 2023-04-06 VITALS — BP 119/86 | HR 101 | Temp 98.8°F | Resp 16

## 2023-04-06 DIAGNOSIS — J209 Acute bronchitis, unspecified: Secondary | ICD-10-CM

## 2023-04-06 MED ORDER — PREDNISONE 20 MG PO TABS: 40.0000 mg | ORAL_TABLET | Freq: Every day | ORAL | 0 refills | Status: AC

## 2023-04-06 MED ORDER — PROMETHAZINE-DM 6.25-15 MG/5ML PO SYRP: 5.0000 mL | ORAL_SOLUTION | Freq: Four times a day (QID) | ORAL | 0 refills | Status: AC | PRN

## 2023-04-06 MED ORDER — ALBUTEROL SULFATE HFA 108 (90 BASE) MCG/ACT IN AERS: 2.0000 | INHALATION_SPRAY | Freq: Four times a day (QID) | RESPIRATORY_TRACT | 0 refills | Status: AC | PRN

## 2023-04-06 NOTE — ED Triage Notes (Addendum)
Pt reports cough, x 1 week. Cough is causing chest tightness, coughing up yellow colored phlegm  feels the need to take multiple deep breathes.

## 2023-04-06 NOTE — ED Provider Notes (Signed)
 [facility]    CSN: [id number] Arrival date & time: 04/06/23  1755      History   Chief Complaint Chief Complaint  Patient presents with   Cough    Entered by patient    HPI Colleen Hamilton is a 32 y.o. female.   The history is provided by the patient.   Patient presents for complaints of cough and chest tightness that been present for the past week.  Patient states that she has been coughing up yellow phlegm.  States that she has noticed an increase need to take deep breaths.  She denies fever, chills, difficulty breathing, shortness of breath, chest pain, abdominal pain, nausea, vomiting, diarrhea, or rash.  Patient reports that she does currently smoke.  States that she has noticed herself wheezing from time to time.  Patient states that she has not taken any medication for her symptoms.  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 (HPV) DNA test positive 07/21/2016   Encounter for routine gynecological examination with Papanicolaou smear of cervix 07/15/2016   Screening for STD (sexually transmitted disease) 07/15/2016   Encounter for initial prescription of contraceptive pills 07/15/2016   Contraceptive education 07/15/2016    Past Surgical History:  Procedure Laterality Date   INDUCED ABORTION      OB History     Gravida  3   Para  1   Term  0   Preterm  0   AB  1   Living  1      SAB      IAB  1   Ectopic  0   Multiple  0   Live Births  1            Home Medications    Prior to Admission medications   Medication Sig Start Date End Date Taking? Authorizing Provider  albuterol  (VENTOLIN  HFA) 108 (90 Base) MCG/ACT inhaler Inhale 2 puffs into the lungs every 6 (six) hours as needed.  04/06/23  Yes Leath-Warren, Etta PARAS, NP  predniSONE  (DELTASONE ) 20 MG tablet Take 2 tablets (40 mg total) by mouth daily with breakfast for 5 days. 04/06/23 04/11/23 Yes Leath-Warren, Etta PARAS, NP  promethazine -dextromethorphan (PROMETHAZINE -DM) 6.25-15 MG/5ML syrup Take 5 mLs by mouth 4 (four) times daily as needed. 04/06/23  Yes Leath-Warren, Etta PARAS, NP  brompheniramine-pseudoephedrine-DM 30-2-10 MG/5ML syrup Take 5 mLs by mouth 4 (four) times daily as needed. 01/07/23   Leath-Warren, Etta PARAS, NP  cetirizine  (ZYRTEC ) 10 MG tablet Take 1 tablet (10 mg total) by mouth daily. 01/07/23   Leath-Warren, Etta PARAS, NP  fluticasone  (FLONASE ) 50 MCG/ACT nasal spray Place 2 sprays into both nostrils daily. 01/07/23   Leath-Warren, Etta PARAS, NP  megestrol  (MEGACE ) 40 MG tablet 3 tablets a day for 5 days, 2 tablets a day for 5 days then 1 tablet daily Patient not taking: Reported on 08/11/2021 07/08/21   Jayne Vonn DEL, MD    Family History Family History  Problem Relation Age of Onset   Hypertension Mother     Social History Social History   Tobacco Use   Smoking status: Every Day    Current packs/day: 0.01    Types: Cigars, Cigarettes   Smokeless tobacco: Never  Vaping Use  Vaping status: Never Used  Substance Use Topics   Alcohol use: Yes    Alcohol/week: 1.0 standard drink of alcohol    Types: 1 Shots of liquor per week   Drug use: Yes    Types: Marijuana     Allergies   Patient has no known allergies.   Review of Systems Review of Systems Per HPI  Physical Exam Triage Vital Signs ED Triage Vitals  Encounter Vitals Group     BP 04/06/23 1814 119/86     Systolic BP Percentile --      Diastolic BP Percentile --      Pulse Rate 04/06/23 1814 (!) 101     Resp 04/06/23 1814 16     Temp 04/06/23 1814 98.8 F (37.1 C)     Temp Source 04/06/23 1814 Oral     SpO2 04/06/23 1814 96 %     Weight --      Height --      Head Circumference --      Peak Flow --      Pain  Score 04/06/23 1817 5     Pain Loc --      Pain Education --      Exclude from Growth Chart --    No data found.  Updated Vital Signs BP 119/86 (BP Location: Right Arm)   Pulse (!) 101   Temp 98.8 F (37.1 C) (Oral)   Resp 16   SpO2 96%   Visual Acuity Right Eye Distance:   Left Eye Distance:   Bilateral Distance:    Right Eye Near:   Left Eye Near:    Bilateral Near:     Physical Exam Vitals and nursing note reviewed.  Constitutional:      General: She is not in acute distress.    Appearance: Normal appearance.  HENT:     Head: Normocephalic.     Right Ear: Tympanic membrane, ear canal and external ear normal.     Left Ear: Tympanic membrane, ear canal and external ear normal.     Nose: Congestion present.     Mouth/Throat:     Mouth: Mucous membranes are moist.  Eyes:     Extraocular Movements: Extraocular movements intact.     Conjunctiva/sclera: Conjunctivae normal.     Pupils: Pupils are equal, round, and reactive to light.  Cardiovascular:     Rate and Rhythm: Normal rate and regular rhythm.     Pulses: Normal pulses.     Heart sounds: Normal heart sounds.  Pulmonary:     Effort: Pulmonary effort is normal. No respiratory distress.     Breath sounds: Normal breath sounds. No stridor. No wheezing, rhonchi or rales.  Abdominal:     General: Bowel sounds are normal.     Palpations: Abdomen is soft.     Tenderness: There is no abdominal tenderness.  Musculoskeletal:     Cervical back: Normal range of motion.  Lymphadenopathy:     Cervical: No cervical adenopathy.  Skin:    General: Skin is warm and dry.  Neurological:     General: No focal deficit present.     Mental Status: She is alert and oriented to person, place, and time.  Psychiatric:        Mood and Affect: Mood normal.        Behavior: Behavior normal.      UC Treatments / Results  Labs (all labs ordered are listed, but only abnormal results are displayed) Labs Reviewed -  No data to  display  EKG   Radiology No results found.  Procedures Procedures (including critical care time)  Medications Ordered in UC Medications - No data to display  Initial Impression / Assessment and Plan / UC Course  I have reviewed the triage vital signs and the nursing notes.  Pertinent labs & imaging results that were available during my care of the patient were reviewed by me and considered in my medical decision making (see chart for details).  On exam, lung sounds are clear throughout, room air sats at 96%.  Suspect patient may be experiencing acute bronchitis.  Will provide treatment with prednisone  40 mg for the next 5 days, Promethazine  DM for her cough, and an albuterol  inhaler as needed for wheezing and shortness of breath.  Supportive care recommendations were provided and discussed with the patient to include fluids, rest, over-the-counter analgesics, and use of a humidifier.  Discussed return indications.  Patient verbalized understanding.  All questions were answered.  Patient stable for discharge.  Work note was provided.   Final Clinical Impressions(s) / UC Diagnoses   Final diagnoses:  Acute bronchitis, unspecified organism     Discharge Instructions      Take medication as prescribed. Increase fluids and allow for plenty of rest. May take over-the-counter Tylenol  or ibuprofen  as needed for pain, fever, or general discomfort. Recommend sleeping slightly elevated and using a humidifier at nighttime during sleep. Also, recommend attempting to cut back on your smoking or to consider smoking sensation while symptoms persist. You may follow-up in this clinic or with your primary care physician if symptoms fail to improve or worsen. Follow-up as needed.     ED Prescriptions     Medication Sig Dispense Auth. Provider   promethazine -dextromethorphan (PROMETHAZINE -DM) 6.25-15 MG/5ML syrup Take 5 mLs by mouth 4 (four) times daily as needed. 118 mL Leath-Warren,  Etta PARAS, NP   predniSONE  (DELTASONE ) 20 MG tablet Take 2 tablets (40 mg total) by mouth daily with breakfast for 5 days. 10 tablet Leath-Warren, Etta PARAS, NP   albuterol  (VENTOLIN  HFA) 108 (90 Base) MCG/ACT inhaler Inhale 2 puffs into the lungs every 6 (six) hours as needed. 8 g Leath-Warren, Etta PARAS, NP      PDMP not reviewed this encounter.   Gilmer Etta PARAS, NP 04/06/23 [phone]

## 2023-04-06 NOTE — Discharge Instructions (Addendum)
 Take medication as prescribed. Increase fluids and allow for plenty of rest. May take over-the-counter Tylenol  or ibuprofen  as needed for pain, fever, or general discomfort. Recommend sleeping slightly elevated and using a humidifier at nighttime during sleep. Also, recommend attempting to cut back on your smoking or to consider smoking sensation while symptoms persist. You may follow-up in this clinic or with your primary care physician if symptoms fail to improve or worsen. Follow-up as needed.

## 2023-11-01 ENCOUNTER — Ambulatory Visit
Admission: EM | Admit: 2023-11-01 | Discharge: 2023-11-01 | Disposition: A | Discharge status: Home / Self Care | Attending: Nurse Practitioner | Admitting: Nurse Practitioner

## 2023-11-01 ENCOUNTER — Ambulatory Visit: Payer: Self-pay

## 2023-11-01 DIAGNOSIS — J069 Acute upper respiratory infection, unspecified: Secondary | ICD-10-CM | POA: Diagnosis not present

## 2023-11-01 LAB — POC COVID19/FLU A&B COMBO
Covid Antigen, POC: NEGATIVE
Influenza A Antigen, POC: NEGATIVE
Influenza B Antigen, POC: NEGATIVE

## 2023-11-01 NOTE — ED Provider Notes (Signed)
 [facility]    CSN: [id number] Arrival date & time: 11/01/23  1628      History   Chief Complaint Chief Complaint  Patient presents with   Nasal Congestion    HPI Colleen Hamilton is a 33 y.o. female.   The history is provided by the patient.   Patient presents with a 2-day history of nasal congestion, lightheadedness, headache, cough and nausea.  Patient denies fever, chills, ear drainage, wheezing, difficulty breathing, abdominal pain, vomiting, diarrhea, or rash.  Patient reports she has been taking ibuprofen  for her symptoms.  She denies any obvious known sick contacts. 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 (HPV) DNA test positive 07/21/2016   Encounter for routine gynecological examination with Papanicolaou smear of cervix 07/15/2016   Screening for STD (sexually transmitted disease) 07/15/2016   Encounter for initial prescription of contraceptive pills 07/15/2016   Contraceptive education 07/15/2016    Past Surgical History:  Procedure Laterality Date   INDUCED ABORTION      OB History     Gravida  3   Para  1   Term  0   Preterm  0   AB  1   Living  1      SAB      IAB  1   Ectopic  0   Multiple  0   Live Births  1            Home Medications    Prior to Admission medications   Medication Sig Start Date End Date Taking? Authorizing Provider  albuterol  (VENTOLIN  HFA) 108 (90 Base) MCG/ACT inhaler Inhale 2 puffs into the lungs every 6 (six) hours as needed. 04/06/23   Leath-Warren, Etta PARAS, NP  brompheniramine-pseudoephedrine-DM 30-2-10 MG/5ML syrup Take 5 mLs by mouth 4 (four) times daily as needed. 01/07/23   Leath-Warren, Etta PARAS, NP  cetirizine  (ZYRTEC ) 10 MG tablet Take 1  tablet (10 mg total) by mouth daily. 01/07/23   Leath-Warren, Etta PARAS, NP  fluticasone  (FLONASE ) 50 MCG/ACT nasal spray Place 2 sprays into both nostrils daily. 01/07/23   Leath-Warren, Etta PARAS, NP  megestrol  (MEGACE ) 40 MG tablet 3 tablets a day for 5 days, 2 tablets a day for 5 days then 1 tablet daily Patient not taking: Reported on 08/11/2021 07/08/21   Jayne Vonn DEL, MD  promethazine -dextromethorphan (PROMETHAZINE -DM) 6.25-15 MG/5ML syrup Take 5 mLs by mouth 4 (four) times daily as needed. 04/06/23   Leath-Warren, Etta PARAS, NP    Family History Family History  Problem Relation Age of Onset   Hypertension Mother     Social History Social History   Tobacco Use   Smoking status: Every Day    Current packs/day: 0.01    Types: Cigars, Cigarettes   Smokeless tobacco: Never  Vaping Use   Vaping status: Never Used  Substance Use Topics   Alcohol use: Yes    Alcohol/week: 1.0 standard drink of alcohol    Types: 1 Shots of liquor per week   Drug use: Yes    Types: Marijuana     Allergies   Patient has no known allergies.   Review of Systems Review of Systems Per HPI  Physical Exam Triage Vital Signs ED Triage Vitals  Encounter Vitals  Group     BP 11/01/23 1659 127/85     Girls Systolic BP Percentile --      Girls Diastolic BP Percentile --      Boys Systolic BP Percentile --      Boys Diastolic BP Percentile --      Pulse Rate 11/01/23 1659 (!) 107     Resp 11/01/23 1659 16     Temp 11/01/23 1659 98.3 F (36.8 C)     Temp Source 11/01/23 1659 Oral     SpO2 11/01/23 1659 93 %     Weight --      Height --      Head Circumference --      Peak Flow --      Pain Score 11/01/23 1701 0     Pain Loc --      Pain Education --      Exclude from Growth Chart --    No data found.  Updated Vital Signs BP 127/85 (BP Location: Right Arm)   Pulse (!) 107   Temp 98.3 F (36.8 C) (Oral)   Resp 16   LMP 10/29/2023 (Exact Date)   SpO2 93%   Visual Acuity Right  Eye Distance:   Left Eye Distance:   Bilateral Distance:    Right Eye Near:   Left Eye Near:    Bilateral Near:     Physical Exam Vitals and nursing note reviewed.  Constitutional:      Appearance: Normal appearance.  HENT:     Head: Normocephalic.     Right Ear: Tympanic membrane, ear canal and external ear normal.     Left Ear: Tympanic membrane, ear canal and external ear normal.     Nose: Congestion present.     Right Turbinates: Enlarged and swollen.     Left Turbinates: Enlarged and swollen.     Right Sinus: No maxillary sinus tenderness or frontal sinus tenderness.     Left Sinus: No maxillary sinus tenderness or frontal sinus tenderness.     Mouth/Throat:     Lips: Pink.     Mouth: Mucous membranes are moist.     Pharynx: Postnasal drip present. No pharyngeal swelling, oropharyngeal exudate, posterior oropharyngeal erythema or uvula swelling.     Comments: Cobblestoning present to posterior oropharynx  Eyes:     Extraocular Movements: Extraocular movements intact.     Conjunctiva/sclera: Conjunctivae normal.     Pupils: Pupils are equal, round, and reactive to light.  Cardiovascular:     Rate and Rhythm: Normal rate and regular rhythm.     Pulses: Normal pulses.     Heart sounds: Normal heart sounds.  Pulmonary:     Effort: Pulmonary effort is normal. No respiratory distress.     Breath sounds: Normal breath sounds. No stridor. No wheezing, rhonchi or rales.  Abdominal:     General: Bowel sounds are normal.     Palpations: Abdomen is soft.     Tenderness: There is no abdominal tenderness.  Musculoskeletal:     Cervical back: Normal range of motion.  Skin:    General: Skin is warm and dry.  Neurological:     General: No focal deficit present.     Mental Status: She is alert and oriented to person, place, and time.  Psychiatric:        Mood and Affect: Mood normal.        Behavior: Behavior normal.      UC Treatments / Results  Labs (  all labs ordered are  listed, but only abnormal results are displayed) Labs Reviewed  POC COVID19/FLU A&B COMBO - Normal    EKG   Radiology No results found.  Procedures Procedures (including critical care time)  Medications Ordered in UC Medications - No data to display  Initial Impression / Assessment and Plan / UC Course  I have reviewed the triage vital signs and the nursing notes.  Pertinent labs & imaging results that were available during my care of the patient were reviewed by me and considered in my medical decision making (see chart for details).  COVID/flu test was negative.  The patient is well-appearing, she is in no acute distress, vital signs are mostly stable.  Symptoms are consistent with a viral upper respiratory infection with cough.  Will provide symptomatic treatment with cetirizine  10 mg, Bromfed-DM for the cough, and fluticasone  50 mcg nasal spray.  Supportive care recommendations were provided and discussed with the patient to include fluids, rest, over-the-counter analgesics, and use of normal saline nasal spray throughout the day for nasal congestion.  Discussed indications with patient regarding follow-up.  Patient was in agreement with this plan of care and verbalizes understanding.  All questions were answered.  Patient stable for discharge.  Final Clinical Impressions(s) / UC Diagnoses   Final diagnoses:  Viral URI with cough     Discharge Instructions      The COVID/flu test was negative. Take medication as prescribed. Increase fluids and allow for plenty of rest. You may take over-the-counter ibuprofen  or Tylenol  as needed for pain, fever, or general discomfort. Recommend normal saline nasal spray throughout the day for nasal congestion and runny nose. For the cough, recommend use of a humidifier in your bedroom at nighttime sleeping elevated on pillows while symptoms persist. Symptoms should improve over the next 5 to 7 days.  You may follow-up in this clinic or  with your primary care physician if symptoms fail to improve or worsen. Follow-up as needed.     ED Prescriptions   None    PDMP not reviewed this encounter.   Gilmer Etta PARAS, NP 11/01/23 1736

## 2023-11-01 NOTE — ED Triage Notes (Signed)
 Congestion, nausea, lightheaded, headache  x 2 days. Taking ibuprofen .

## 2023-11-01 NOTE — Discharge Instructions (Addendum)
 The COVID/flu test was negative. Take medication as prescribed. Increase fluids and allow for plenty of rest. You may take over-the-counter ibuprofen  or Tylenol  as needed for pain, fever, or general discomfort. Recommend normal saline nasal spray throughout the day for nasal congestion and runny nose. For the cough, recommend use of a humidifier in your bedroom at nighttime sleeping elevated on pillows while symptoms persist. Symptoms should improve over the next 5 to 7 days.  You may follow-up in this clinic or with your primary care physician if symptoms fail to improve or worsen. Follow-up as needed.

## 2023-12-30 ENCOUNTER — Ambulatory Visit: Admitting: Family Medicine
